# Patient Record
Sex: Female | Born: 1960 | Race: Black or African American | Hispanic: No | Smoking: Never smoker
Health system: Southern US, Community
[De-identification: ages and names within clinical notes are randomized; demographics above are authoritative.]

## PROBLEM LIST (undated history)

## (undated) DIAGNOSIS — G4730 Sleep apnea, unspecified: Secondary | ICD-10-CM

## (undated) DIAGNOSIS — E785 Hyperlipidemia, unspecified: Secondary | ICD-10-CM

## (undated) DIAGNOSIS — N924 Excessive bleeding in the premenopausal period: Secondary | ICD-10-CM

## (undated) HISTORY — DX: Excessive bleeding in the premenopausal period: N92.4

## (undated) HISTORY — DX: Hyperlipidemia, unspecified: E78.5

---

## 1995-03-14 HISTORY — PX: NASAL SEPTUM SURGERY: SHX37

## 1995-03-14 HISTORY — PX: UVULOPALATOPHARYNGOPLASTY: SHX827

## 1997-06-25 ENCOUNTER — Ambulatory Visit (HOSPITAL_BASED_OUTPATIENT_CLINIC_OR_DEPARTMENT_OTHER): Admission: RE | Admit: 1997-06-25 | Discharge: 1997-06-25 | Payer: Self-pay | Admitting: Otolaryngology

## 1999-04-12 ENCOUNTER — Other Ambulatory Visit: Admission: RE | Admit: 1999-04-12 | Discharge: 1999-04-12 | Payer: Self-pay | Admitting: Family Medicine

## 2000-04-12 ENCOUNTER — Other Ambulatory Visit: Admission: RE | Admit: 2000-04-12 | Discharge: 2000-04-12 | Payer: Self-pay | Admitting: Family Medicine

## 2001-05-07 ENCOUNTER — Other Ambulatory Visit: Admission: RE | Admit: 2001-05-07 | Discharge: 2001-05-07 | Payer: Self-pay | Admitting: Family Medicine

## 2001-05-14 ENCOUNTER — Encounter: Admission: RE | Admit: 2001-05-14 | Discharge: 2001-08-12 | Payer: Self-pay | Admitting: Family Medicine

## 2001-10-30 ENCOUNTER — Encounter: Admission: RE | Admit: 2001-10-30 | Discharge: 2002-01-28 | Payer: Self-pay | Admitting: Family Medicine

## 2002-01-30 ENCOUNTER — Encounter: Admission: RE | Admit: 2002-01-30 | Discharge: 2002-04-30 | Payer: Self-pay | Admitting: Family Medicine

## 2002-05-19 ENCOUNTER — Encounter: Admission: RE | Admit: 2002-05-19 | Discharge: 2002-08-17 | Payer: Self-pay | Admitting: Family Medicine

## 2002-05-21 ENCOUNTER — Other Ambulatory Visit: Admission: RE | Admit: 2002-05-21 | Discharge: 2002-05-21 | Payer: Self-pay | Admitting: Family Medicine

## 2003-05-28 ENCOUNTER — Other Ambulatory Visit: Admission: RE | Admit: 2003-05-28 | Discharge: 2003-05-28 | Payer: Self-pay | Admitting: Family Medicine

## 2003-12-30 ENCOUNTER — Inpatient Hospital Stay (HOSPITAL_COMMUNITY): Admission: EM | Admit: 2003-12-30 | Discharge: 2004-01-01 | Payer: Self-pay | Admitting: Emergency Medicine

## 2004-01-22 ENCOUNTER — Ambulatory Visit (HOSPITAL_COMMUNITY): Admission: RE | Admit: 2004-01-22 | Discharge: 2004-01-22 | Payer: Self-pay | Admitting: Family Medicine

## 2004-02-11 ENCOUNTER — Encounter: Admission: RE | Admit: 2004-02-11 | Discharge: 2004-05-11 | Payer: Self-pay | Admitting: Family Medicine

## 2004-07-12 ENCOUNTER — Other Ambulatory Visit: Admission: RE | Admit: 2004-07-12 | Discharge: 2004-07-12 | Payer: Self-pay | Admitting: Family Medicine

## 2005-07-20 IMAGING — NM NM MYOCAR MULTI W/ SPECT
1 series · 1 of 1 positions shown · non-contrast
Comparison: none

CLINICAL DATA: 42 year old with chest pain. 
 NUCLEAR MEDICINE MYOCARDIAL MULTIPLE WITH SPECT, PERFUSION EJECTION FRACTION, PERFUSION WALL MOTION 
 Nuclear medicine myocardial perfusion was performed with Technetium Cardiolite.  Stress and rest imaging was performed.   Gated SPECT images were obtained to evaluate wall motion and myocardial thickening with contraction.   
 The stress and rest SPECT images demonstrate mild apical thinning which is a normal variant. I don?t see any fixed or reversible defects to suggest infarction or ischemia. The gated SPECT images demonstrate normal wall motion and myocardial thickening with contraction.  The patient?s estimated ejection fraction was 82 percent. 
 IMPRESSION 
 Mild apical thinning which is a normal variant.  
 No infarction or ischemia.  
 Normal wall motion with estimated ejection of 82 percent.

[Series 1: sc stress gated cardio · 1 of 1 slices shown]
[im 1/1]
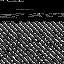

[1 of 1 positions shown; findings below may reference images not displayed]

## 2007-02-28 ENCOUNTER — Encounter: Admission: RE | Admit: 2007-02-28 | Discharge: 2007-02-28 | Payer: Self-pay | Admitting: Family Medicine

## 2007-04-11 ENCOUNTER — Encounter: Admission: RE | Admit: 2007-04-11 | Discharge: 2007-04-11 | Payer: Self-pay

## 2009-02-22 ENCOUNTER — Ambulatory Visit: Payer: Self-pay | Admitting: Occupational Medicine

## 2009-02-22 DIAGNOSIS — J45909 Unspecified asthma, uncomplicated: Secondary | ICD-10-CM | POA: Insufficient documentation

## 2009-02-22 DIAGNOSIS — E785 Hyperlipidemia, unspecified: Secondary | ICD-10-CM | POA: Insufficient documentation

## 2009-04-07 ENCOUNTER — Other Ambulatory Visit: Admission: RE | Admit: 2009-04-07 | Discharge: 2009-04-07 | Payer: Self-pay | Admitting: Family Medicine

## 2009-04-07 ENCOUNTER — Ambulatory Visit: Payer: Self-pay | Admitting: Family Medicine

## 2009-04-07 DIAGNOSIS — R5381 Other malaise: Secondary | ICD-10-CM

## 2009-04-07 DIAGNOSIS — R5383 Other fatigue: Secondary | ICD-10-CM

## 2009-04-08 LAB — CONVERTED CEMR LAB
ALT: 14 units/L (ref 0–35)
AST: 17 units/L (ref 0–37)
Albumin: 4.3 g/dL (ref 3.5–5.2)
Alkaline Phosphatase: 46 units/L (ref 39–117)
BUN: 14 mg/dL (ref 6–23)
CO2: 23 meq/L (ref 19–32)
Calcium: 9.2 mg/dL (ref 8.4–10.5)
Chloride: 106 meq/L (ref 96–112)
Cholesterol: 264 mg/dL — ABNORMAL HIGH (ref 0–200)
Creatinine, Ser: 0.83 mg/dL (ref 0.40–1.20)
Glucose, Bld: 100 mg/dL — ABNORMAL HIGH (ref 70–99)
HCT: 38.6 % (ref 36.0–46.0)
HDL: 49 mg/dL (ref >39)
Hemoglobin: 12.6 g/dL (ref 12.0–15.0)
LDL Cholesterol: 179 mg/dL — ABNORMAL HIGH (ref 0–99)
MCHC: 32.6 g/dL (ref 30.0–36.0)
MCV: 87.1 fL (ref 78.0–100.0)
Platelets: 251 10*3/uL (ref 150–400)
Potassium: 4.2 meq/L (ref 3.5–5.3)
RBC: 4.43 M/uL (ref 3.87–5.11)
RDW: 12.9 % (ref 11.5–15.5)
Sodium: 142 meq/L (ref 135–145)
TSH: 2.481 microintl units/mL (ref 0.350–4.500)
Total Bilirubin: 0.6 mg/dL (ref 0.3–1.2)
Total CHOL/HDL Ratio: 5.4
Total Protein: 7.1 g/dL (ref 6.0–8.3)
Triglycerides: 180 mg/dL — ABNORMAL HIGH (ref <150)
VLDL: 36 mg/dL (ref 0–40)
WBC: 6.5 10*3/uL (ref 4.0–10.5)

## 2009-04-12 ENCOUNTER — Encounter: Payer: Self-pay | Admitting: Family Medicine

## 2009-04-12 LAB — CONVERTED CEMR LAB: Pap Smear: NEGATIVE

## 2009-05-10 ENCOUNTER — Ambulatory Visit: Payer: Self-pay | Admitting: Family Medicine

## 2009-05-10 DIAGNOSIS — E669 Obesity, unspecified: Secondary | ICD-10-CM

## 2009-05-14 ENCOUNTER — Encounter: Payer: Self-pay | Admitting: Family Medicine

## 2009-05-18 ENCOUNTER — Encounter: Payer: Self-pay | Admitting: Family Medicine

## 2009-06-10 ENCOUNTER — Ambulatory Visit: Payer: Self-pay | Admitting: Family Medicine

## 2009-06-10 DIAGNOSIS — E559 Vitamin D deficiency, unspecified: Secondary | ICD-10-CM | POA: Insufficient documentation

## 2009-06-11 LAB — CONVERTED CEMR LAB
ALT: 17 units/L (ref 0–35)
AST: 17 units/L (ref 0–37)
Direct LDL: 117 mg/dL — ABNORMAL HIGH
Vit D, 25-Hydroxy: 59 ng/mL (ref 30–89)

## 2009-07-22 ENCOUNTER — Encounter: Payer: Self-pay | Admitting: Family Medicine

## 2010-04-02 ENCOUNTER — Encounter: Payer: Self-pay | Admitting: Family Medicine

## 2010-04-12 NOTE — Miscellaneous (Signed)
Summary: colonoscopy normal  Clinical Lists Changes  Observations: Added new observation of COLONRECACT: Repeat colonoscopy in 10 years.   (05/14/2009 20:20) Added new observation of COLONOSCOPY: Location:  [facility].    normal.   (05/14/2009 20:20)      Colonoscopy  Procedure date:  05/14/2009  Findings:      Location:  [facility].    normal.    Comments:      Repeat colonoscopy in 10 years.     Colonoscopy  Procedure date:  05/14/2009  Findings:      Location:  [facility].    normal.    Comments:      Repeat colonoscopy in 10 years.

## 2010-04-12 NOTE — Assessment & Plan Note (Signed)
Summary: weight   Vital Signs:  Patient profile:   50 year old female Menstrual status:  regular Height:      63 inches Weight:      175 pounds BMI:     31.11 O2 Sat:      99 % on Room air Temp:     98.6 degrees F oral Pulse rate:   85 / minute BP sitting:   133 / 80  (left arm) Cuff size:   regular  Vitals Entered By: Payton Spark CMA (May 10, 2009 10:39 AM)  O2 Flow:  Room air CC: F/U weight. Phentermine causing insomnia   Primary Care Provider:  Seymour Bars DO  CC:  F/U weight. Phentermine causing insomnia.  History of Present Illness: 50 yo AAF presents for f/u weight mgmt.  She started on Phentermine 1 month ago and did well other than insomnia.  She was taking it at 0800 am. She usually goes to bed at 12:30 am.  She does not drink caffeine. She has noticed that her appetite was decreased on the medication. No heart palpitations or tremor.  She did lose 8 lbs in 1 month.  She has made healthy diet changes.  She is walking more > 1 hr most days/ wk.      Current Medications (verified): 1)  Singulair 10 Mg Tabs (Montelukast Sodium) .... Take 1 Tab By Mouth Once Daily 2)  Crestor 20 Mg Tabs (Rosuvastatin Calcium) .Marland Kitchen.. 1 Tab By Mouth Qhs 3)  Nexium 40 Mg Cpdr (Esomeprazole Magnesium) .... Take 1 Cap By Mouth Once Daily 4)  Symbicort 80-4.5 Mcg/act Aero (Budesonide-Formoterol Fumarate) .... Use As Directed As Needed 5)  Flonase 50 Mcg/act Susp (Fluticasone Propionate) .... Use As Directed As Needed 6)  Proair Hfa 108 (90 Base) Mcg/act Aers (Albuterol Sulfate) .... Use As Directed As Needed 7)  Clobex 0.05 % Lotn (Clobetasol Propionate) .... Use As Directed 8)  Protopic 0.03 % Oint (Tacrolimus) .... Use As Directed  Allergies (verified): 1)  ! Codeine  Review of Systems      See HPI  Physical Exam  General:  alert, well-developed, well-nourished, well-hydrated, and overweight-appearing.   Head:  normocephalic and atraumatic.   Psych:  good eye contact, not  anxious appearing, and not depressed appearing.     Impression & Recommendations:  Problem # 1:  OBESITY, UNSPECIFIED (ICD-278.00) Lost 8 lbs on Phentermine x 3 wks.  Stopped it due to insomnia as SE.  BP looks great.  Worked well to curb her appetite and she is doing better with healthy diet and regular exercise now.  Continue and change to Diethylproprion.  f/u in 1 month.  Call if any problems.  BMI > 31.    Complete Medication List: 1)  Singulair 10 Mg Tabs (Montelukast sodium) .... Take 1 tab by mouth once daily 2)  Crestor 20 Mg Tabs (Rosuvastatin calcium) .Marland Kitchen.. 1 tab by mouth qhs 3)  Nexium 40 Mg Cpdr (Esomeprazole magnesium) .... Take 1 cap by mouth once daily 4)  Symbicort 80-4.5 Mcg/act Aero (Budesonide-formoterol fumarate) .... Use as directed as needed 5)  Flonase 50 Mcg/act Susp (Fluticasone propionate) .... Use as directed as needed 6)  Proair Hfa 108 (90 Base) Mcg/act Aers (Albuterol sulfate) .... Use as directed as needed 7)  Clobex 0.05 % Lotn (Clobetasol propionate) .... Use as directed 8)  Protopic 0.03 % Oint (Tacrolimus) .... Use as directed 9)  Diethylpropion Hcl Cr 75 Mg Xr24h-tab (Diethylpropion hcl) .Marland Kitchen.. 1 tab by mouth  qam, take 30 min before breakfast  Patient Instructions: 1)  Change Phentermine to Diethylproprion as your appetitie suppressant.   2)  Continue healthy diet, regular exercise. 3)  Return in 1 month for f/u visit. Prescriptions: DIETHYLPROPION HCL CR 75 MG XR24H-TAB (DIETHYLPROPION HCL) 1 tab by mouth qAM, take 30 min before breakfast  #30 x 0   Entered and Authorized by:   Seymour Bars DO   Signed by:   Seymour Bars DO on 05/10/2009   Method used:   Printed then faxed to ...       [facility] [phone]* (retail)       [address] [place], [place]  [address]       Ph: [phone]       Fax: [phone]   RxID:   [id number]

## 2010-04-12 NOTE — Assessment & Plan Note (Signed)
Summary: NOV CPE with pap/EKG   Vital Signs:  Patient profile:   50 year old female Menstrual status:  regular LMP:     03/29/2009 Height:      63 inches Weight:      183 pounds BMI:     32.53 O2 Sat:      98 % on Room air Temp:     98.7 degrees F oral Pulse rate:   98 / minute BP sitting:   137 / 80  (left arm) Cuff size:   regular  Vitals Entered By: Payton Spark CMA (April 07, 2009 9:05 AM)  O2 Flow:  Room air CC: New to est. CPE w/ pap LMP (date): 03/29/2009     Menstrual Status regular Enter LMP: 03/29/2009   Primary Care Provider:  Seymour Bars DO  CC:  New to est. CPE w/ pap.  History of Present Illness: 50 yo WF presents for NOV, CPE wtih pap.  She has hx of asthma, high chol, GERD.  She is due for RF on meds and fasting labs.  She is G1P0.  Not in any relationships.  She has some fatigue with weight gain.  She has a + fam hx of premature heart dz. She has not had any CP or DOE.  She is due for her first screening colonoscopy.  Her mammogram is due in March.  Her periods are still regular each month.  Her tetanus was updated about 4 yrs ago.    Current Medications (verified): 1)  Singulair 10 Mg Tabs (Montelukast Sodium) .... Take 1 Tab By Mouth Once Daily 2)  Vytorin 10-20 Mg Tabs (Ezetimibe-Simvastatin) .... Take 1 Tab By Mouth Once Daily 3)  Nexium 40 Mg Cpdr (Esomeprazole Magnesium) .... Take 1 Cap By Mouth Once Daily 4)  Symbicort 80-4.5 Mcg/act Aero (Budesonide-Formoterol Fumarate) .... Use As Directed As Needed 5)  Flonase 50 Mcg/act Susp (Fluticasone Propionate) .... Use As Directed As Needed 6)  Proair Hfa 108 (90 Base) Mcg/act Aers (Albuterol Sulfate) .... Use As Directed As Needed 7)  Clobex 0.05 % Lotn (Clobetasol Propionate) .... Use As Directed 8)  Protopic 0.03 % Oint (Tacrolimus) .... Use As Directed  Allergies (verified): 1)  ! 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  No kids. Lives w/ her sister.  no relationships. Never smoked.  Denies ETOH. No regular exercise.  Review of Systems       no fevers/sweats/weakness, unexplained wt loss/gain, no change in vision, no difficulty hearing, ringing in ears,+ hay fever/allergies, no CP/discomfort, no palpitations, no breast lump/nipple discharge, no cough/wheeze, no blood in stool, N/V/D, no nocturia, no leaking urine, no unusual vag bleeding, no vaginal/penile discharge, no muscle/joint pain, no rash, no new/changing mole, no HA, no memory loss, no anxiety, no sleep problem, no depression, no unexplained lumps, no easy bruising/bleeding, no concern with sexual function   Physical Exam  General:  alert, well-developed, well-nourished, well-hydrated, and overweight-appearing.   Head:  normocephalic and atraumatic.   Eyes:  pupils equal, pupils round, and pupils reactive to light.  allergic shiners present Ears:  no external deformities.   Nose:  no nasal discharge.  Mouth:  good dentition and pharynx pink and moist.   Neck:  no masses.   Breasts:  No mass, nodules, thickening, tenderness, bulging, retraction, inflamation, nipple discharge or skin changes noted.   Lungs:  Normal respiratory effort, chest expands symmetrically. Lungs are clear to auscultation, no crackles or wheezes. Heart:  Normal rate and regular rhythm. S1 and S2 normal without gallop, murmur, click, rub or other extra sounds. Abdomen:  Bowel sounds positive,abdomen soft and non-tender without masses, organomegaly or hernias noted. Genitalia:  Pelvic Exam:        External: normal female genitalia without lesions or masses        Vagina:  normal without lesions or masses        Cervix: normal without lesions or masses        Adnexa: normal bimanual exam without masses or fullness        Uterus: normal by palpation        Pap smear: performed Pulses:  2+ radial and pedal pulses Extremities:  no E/C/C Skin:  color normal and no suspicious lesions.   Cervical Nodes:  No lymphadenopathy noted Psych:  good eye contact, not anxious appearing, and not depressed appearing.     Impression & Recommendations:  Problem # 1:  ROUTINE GYNECOLOGICAL EXAMINATION (ICD-V72.31) Keeping healthy checklist for women reviewed. BP in the pre-HTN range. RFd chronic meds. Update fasting labs. Schedule 1st screening colonoscopy. Tdap is UTD. Mammogram due in march at [place]. EKG today for + fam hx.  NSR at 87 bpm.  Normal axis.  No ischemia.  QTc 411 msec.  Complete Medication List: 1)  Singulair 10 Mg Tabs (Montelukast sodium) .... Take 1 tab by mouth once daily 2)  Vytorin 10-20 Mg Tabs (Ezetimibe-simvastatin) .... Take 1 tab by mouth once daily 3)  Nexium 40 Mg Cpdr (Esomeprazole magnesium) .... Take 1 cap by mouth once daily 4)  Symbicort 80-4.5 Mcg/act Aero (Budesonide-formoterol fumarate) .... Use as directed as needed 5)  Flonase 50 Mcg/act Susp (Fluticasone propionate) .... Use as directed as needed 6)  Proair Hfa 108 (90 Base) Mcg/act Aers (Albuterol sulfate) .... Use as directed as needed 7)  Clobex 0.05 % Lotn (Clobetasol propionate) .... Use as directed 8)  Protopic 0.03 % Oint (Tacrolimus) .... Use as directed 9)  Phentermine Hcl 15 Mg Caps (Phentermine hcl) .Marland Kitchen.. 1 capsule by mouth qam; take 30 min before breakfast  Other Orders: T-CBC No Diff ([id number]) T-Lipid Profile ([id number]) T-Comprehensive Metabolic Panel ([id number]) T-TSH ([id number]) Gastroenterology Referral (GI)  Patient Instructions: 1)  Meds RFd. 2)  Update fasting labs today. 3)  Will call you with pap smear and lab results in the next 2  days. 4)  Work on healthy diet: 1500 kcal/ day + 1 hr of exercise 4-5 days/ wk. 5)  REturn in 1 month for f/u Prescriptions: PHENTERMINE HCL 15 MG CAPS (PHENTERMINE HCL) 1 capsule by mouth qAM; take 30 min before breakfast  #30 x 0   Entered and Authorized by:   Seymour Bars DO   Signed by:   Seymour Bars DO on 04/07/2009   Method used:   Printed then faxed to ...       [facility] [phone]* (retail)       [address] [place], [place]  [address]       Ph: [phone]       Fax: [phone]   RxID:   [id number] PROTOPIC 0.03 % OINT (  TACROLIMUS) Use as directed  #1 tube x 2   Entered and Authorized by:   Seymour Bars DO   Signed by:   Seymour Bars DO on 04/07/2009   Method used:   Electronically to        [facility] [phone]* (retail)       [address] [place], [place]  [address]       Ph: [phone]       Fax: [phone]   RxID:   [id number] CLOBEX 0.05 % LOTN (CLOBETASOL PROPIONATE) Use as directed  #1 tube x 2   Entered and Authorized by:   Seymour Bars DO   Signed by:   Seymour Bars DO on 04/07/2009   Method used:   Electronically to        [facility] [phone]* (retail)       [address]       [place], [place]  [address]       Ph: [phone]       Fax: [phone]   RxID:   [id number] PROAIR HFA 108 (90 BASE) MCG/ACT AERS (ALBUTEROL SULFATE) Use as directed as needed  #1 x 2   Entered and Authorized by:   Seymour Bars DO   Signed by:   Seymour Bars DO on 04/07/2009   Method used:   Electronically to        [facility] [phone]* (retail)       [address]       [place], [place]  [address]       Ph: [phone]       Fax: [phone]   RxID:   [id number] FLONASE 50 MCG/ACT SUSP (FLUTICASONE PROPIONATE) Use as directed as needed  #1 x 3   Entered and Authorized by:   Seymour Bars DO   Signed by:   Seymour Bars DO on 04/07/2009   Method used:   Electronically to        [facility] [phone]* (retail)        [address]       [place], [place]  [address]       Ph: [phone]       Fax: [phone]   RxID:   [id number] SYMBICORT 80-4.5 MCG/ACT AERO (BUDESONIDE-FORMOTEROL FUMARATE) Use as directed as needed  #1 x 3   Entered and Authorized by:   Seymour Bars DO   Signed by:   Seymour Bars DO on 04/07/2009   Method used:   Electronically to        [facility] [phone]* (retail)       [address]       [place], [place]  [address]       Ph: [phone]       Fax: [phone]   RxID:   [id number] NEXIUM 40 MG CPDR (ESOMEPRAZOLE MAGNESIUM) Take 1 cap by mouth once daily  #30 x 6   Entered and Authorized by:   Seymour Bars DO   Signed by:   Seymour Bars DO on 04/07/2009   Method used:   Electronically to        [facility] [phone]* (retail)       [address]       [place], [place]  [address]       Ph: [phone]       Fax: [phone]   RxID:   [id number]  10-20 MG TABS (EZETIMIBE-SIMVASTATIN) Take 1 tab by mouth once daily  #30 x 3   Entered and Authorized by:   Seymour Bars DO   Signed by:   Seymour Bars DO on 04/07/2009   Method used:   Electronically to        [facility] [phone]* (retail)       [address] [place], [place]  [address]       Ph: [phone]       Fax: [phone]   RxID:   [id number] SINGULAIR 10 MG TABS (MONTELUKAST SODIUM) Take 1 tab by mouth once daily  #30 x 3   Entered and Authorized by:   Seymour Bars DO   Signed by:   Seymour Bars DO on 04/07/2009   Method used:   Electronically to        [facility] [phone]* (retail)       [address]       [place], [place]  [address]       Ph: [phone]       Fax: [phone]   RxID:   [id number]    Tetanus/Td Immunization History:    Tetanus/Td # 1:  Historical (03/13/2006)  Influenza Immunization History:    Influenza # 1:  Historical (12/11/2008)  Appended Document: NOV CPE with pap/EKG

## 2010-04-12 NOTE — Assessment & Plan Note (Signed)
Summary: f/u weight/ cholesterol   Vital Signs:  Patient profile:   50 year old female Menstrual status:  regular Height:      63 inches Weight:      175 pounds BMI:     31.11 O2 Sat:      98 % on Room air Pulse rate:   92 / minute BP sitting:   128 / 84  (left arm) Cuff size:   regular  Vitals Entered By: Payton Spark CMA (June 10, 2009 8:29 AM)  O2 Flow:  Room air CC: F/U weight. Did not complete diethylpropion.    Primary Care Provider:  Seymour Bars DO  CC:  F/U weight. Did not complete diethylpropion. Marland Kitchen  History of Present Illness: 50 yo AAF presents for f/u weight managment.  She took Diethylproprion for weight loss for 16 days in the past month but stopped it due to continued insomnia.  She did not notice much impact in her appetite.    She denies heart palpitations.  She plans to walk more.  She is trying to eat more fruits and veggies.  She has cut back on fried foods.  She gave up chocholate for [place].  She avoids caffeine.  She is drinking more water.  She is feeling tired still from not sleeping well.  Her weight is unchanged.      Current Medications (verified): 1)  Singulair 10 Mg Tabs (Montelukast Sodium) .... Take 1 Tab By Mouth Once Daily 2)  Crestor 20 Mg Tabs (Rosuvastatin Calcium) .Marland Kitchen.. 1 Tab By Mouth Qhs 3)  Nexium 40 Mg Cpdr (Esomeprazole Magnesium) .... Take 1 Cap By Mouth Once Daily 4)  Symbicort 80-4.5 Mcg/act Aero (Budesonide-Formoterol Fumarate) .... Use As Directed As Needed 5)  Flonase 50 Mcg/act Susp (Fluticasone Propionate) .... Use As Directed As Needed 6)  Proair Hfa 108 (90 Base) Mcg/act Aers (Albuterol Sulfate) .... Use As Directed As Needed 7)  Clobex 0.05 % Lotn (Clobetasol Propionate) .... Use As Directed 8)  Protopic 0.03 % Oint (Tacrolimus) .... Use As Directed  Allergies (verified): 1)  ! Codeine  Past History:  Past Medical History: Reviewed history from 04/07/2009 and no changes required. G1P0010 GERD (EGD 07) High  cholesterol IFG asthma/ allergies/ eczema  Family History: Reviewed history from 04/07/2009 and no changes required. mother died at 56, had AMI in late 69s, High chol, HTN, DM father died at 60, had CVAs, AMI, high chol, HTN, alzheimers MAunt bladder cancder GM Leukemia  Social History: Reviewed history from 04/07/2009 and no changes required. 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 Lives w/ her sister.  no relationships. Never smoked.  Denies ETOH. No regular exercise.  Review of Systems      See HPI  Physical Exam  General:  alert, well-developed, well-nourished, well-hydrated, and overweight-appearing.   Head:  normocephalic and atraumatic.   Eyes:  allergic shiners present conjunctiva clear Ears:  no external deformities.   Mouth:  pharynx pink and moist.   Neck:  no masses.   Lungs:  Normal respiratory effort, chest expands symmetrically. Lungs are clear to auscultation, no crackles or wheezes. Heart:  Normal rate and regular rhythm. S1 and S2 normal without gallop, murmur, click, rub or other extra sounds. Extremities:  no LE edema Skin:  color normal.   hand eczema Psych:  good eye contact, not anxious appearing, and not depressed appearing.     Impression & Recommendations:  Problem # 1:  OBESITY, UNSPECIFIED (ICD-278.00) BMI/ weight  unchanged.  BMI 31 c/w class I obesity.  Could not tolerate SE from Diethylproprion or Phentermine (on each for about 2 wks).  Will avoid future use of appetitie suppressants and reset her sleep with samples of Lunesta 2 mg at bedtime today (#4 tabs ) given. It seems that her inability to lose weight is from not attempting to make adequate diet or exercise changes.  Explained to pt that without changes, she will not see results.  Problem # 2:  HYPERLIPIDEMIA (ICD-272.4) Repeat labs today to see if Crestor is working. Her updated medication list for this problem includes:    Crestor 20 Mg Tabs (Rosuvastatin  calcium) .Marland Kitchen... 1 tab by mouth qhs  Orders: T-LDL Direct ([id number]) T-ALT/SGPT ([id number]) T-AST/SGOT ([id number])  Labs Reviewed: SGOT: 17 (04/07/2009)   SGPT: 14 (04/07/2009)   HDL:49 (04/07/2009)  LDL:179 (04/07/2009)  Chol:264 (04/07/2009)  Trig:180 (04/07/2009)  Complete Medication List: 1)  Singulair 10 Mg Tabs (Montelukast sodium) .... Take 1 tab by mouth once daily 2)  Crestor 20 Mg Tabs (Rosuvastatin calcium) .Marland Kitchen.. 1 tab by mouth qhs 3)  Nexium 40 Mg Cpdr (Esomeprazole magnesium) .... Take 1 cap by mouth once daily 4)  Symbicort 80-4.5 Mcg/act Aero (Budesonide-formoterol fumarate) .... Use as directed as needed 5)  Flonase 50 Mcg/act Susp (Fluticasone propionate) .... Use as directed as needed 6)  Proair Hfa 108 (90 Base) Mcg/act Aers (Albuterol sulfate) .... Use as directed as needed 7)  Clobex 0.05 % Lotn (Clobetasol propionate) .... Use as directed 8)  Protopic 0.03 % Oint (Tacrolimus) .... Use as directed  Other Orders: T-Vitamin D (25-Hydroxy) ([id number])  Patient Instructions: 1)  Labs today. 2)  Will call you w/ results tomorrow. 3)  Reset sleep cycle with use of Lunesta 2 mg tab at bedtime.  (#4 tabs given). 4)  Work on [employer] with 1 hr of exercise 5 days/ wk. 5)  Return for f/u in 4 mos.

## 2010-04-12 NOTE — Letter (Signed)
Summary: [facility]  [facility]   Imported By: Lanelle Bal 05/04/2009 13:43:30  _____________________________________________________________________  External Attachment:    Type:   Image     Comment:   External Document

## 2010-04-12 NOTE — Miscellaneous (Signed)
Summary: mammogram normal  Clinical Lists Changes  Observations: Added new observation of MAMMRECACT: Screening mammogram in 1 year.    (07/21/2009 12:44) Added new observation of MAMMOGRAM: [facility]  No significant changes compared to previous study.  Assessment: BIRADS 1.  (07/21/2009 12:44)      Mammogram  Procedure date:  07/21/2009  Findings:      [facility]  No significant changes compared to previous study.  Assessment: BIRADS 1.   Comments:      Screening mammogram in 1 year.      Mammogram  Procedure date:  07/21/2009  Findings:      [facility]  No significant changes compared to previous study.  Assessment: BIRADS 1.   Comments:      Screening mammogram in 1 year.

## 2010-04-12 NOTE — Letter (Signed)
Summary: Letter with Colonoscopy Results/[facility]  Letter with Colonoscopy Results/[facility]   Imported By: Lanelle Bal 05/24/2009 13:30:20  _____________________________________________________________________  External Attachment:    Type:   Image     Comment:   External Document

## 2010-05-06 ENCOUNTER — Encounter: Payer: Self-pay | Admitting: Family Medicine

## 2010-05-06 ENCOUNTER — Other Ambulatory Visit: Payer: Self-pay | Admitting: Family Medicine

## 2010-05-06 ENCOUNTER — Encounter (INDEPENDENT_AMBULATORY_CARE_PROVIDER_SITE_OTHER): Payer: Self-pay | Admitting: Family Medicine

## 2010-05-06 DIAGNOSIS — Z01419 Encounter for gynecological examination (general) (routine) without abnormal findings: Secondary | ICD-10-CM

## 2010-05-06 LAB — CYTOLOGY - PAP: Pap Smear: NORMAL

## 2010-05-09 LAB — CONVERTED CEMR LAB
ALT: 17 units/L (ref 0–35)
AST: 19 units/L (ref 0–37)
Albumin: 4.2 g/dL (ref 3.5–5.2)
Alkaline Phosphatase: 51 units/L (ref 39–117)
BUN: 13 mg/dL (ref 6–23)
CO2: 24 meq/L (ref 19–32)
Calcium: 9.4 mg/dL (ref 8.4–10.5)
Chloride: 104 meq/L (ref 96–112)
Cholesterol: 182 mg/dL (ref 0–200)
Creatinine, Ser: 0.85 mg/dL (ref 0.40–1.20)
Glucose, Bld: 106 mg/dL — ABNORMAL HIGH (ref 70–99)
HDL: 46 mg/dL (ref >39)
LDL Cholesterol: 106 mg/dL — ABNORMAL HIGH (ref 0–99)
Potassium: 4.1 meq/L (ref 3.5–5.3)
Sodium: 140 meq/L (ref 135–145)
Total Bilirubin: 0.6 mg/dL (ref 0.3–1.2)
Total CHOL/HDL Ratio: 4
Total Protein: 6.6 g/dL (ref 6.0–8.3)
Triglycerides: 149 mg/dL (ref <150)
VLDL: 30 mg/dL (ref 0–40)

## 2010-05-10 NOTE — Assessment & Plan Note (Signed)
Summary: CPE with pap   Vital Signs:  Patient profile:   50 year old female Height:      63 inches (160.02 cm) Weight:      186 pounds (84.55 kg) BMI:     33.07 Temp:     97.8 degrees F (36.56 degrees C) oral BP sitting:   136 / 84  (right arm) Cuff size:   regular  Vitals Entered By: Lucious Groves CMA (May 06, 2010 10:32 AM) CC: CPE./kb   Primary Care Provider:  Seymour Bars DO  CC:  CPE./kb.  History of Present Illness: 50 yo AAF presents for CPE with pap.  She continues to have regular periods each month, not too heavy.  She is not having too many vasomotor symptoms.    She had a normal pap smear last year.  She is not sexually active.  She had a normal stress test 5 yrs ago.  She was found to have esophageal spasm at the time.  She does have a fam hx of premature heart dz.   She had her mammogram in May. Due for fasting labs. Immunizations UTD.      Current Medications (verified): 1)  Singulair 10 Mg Tabs (Montelukast Sodium) .... Take 1 Tab By Mouth Once Daily 2)  Nexium 40 Mg Cpdr (Esomeprazole Magnesium) 3)  Symbicort 80-4.5 Mcg/act Aero (Budesonide-Formoterol Fumarate) 4)  Flonase 50 Mcg/act Susp (Fluticasone Propionate) .... Two Sprays in Each Nostril Once A Day 5)  Crestor 20 Mg Tabs (Rosuvastatin Calcium) .Marland Kitchen.. 1 By Mouth Once Daily  Allergies (verified): 1)  ! 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    Physical Exam  General:  alert, well-developed, well-nourished, well-hydrated, and overweight-appearing.   Head:  normocephalic and atraumatic.   Eyes:  PERRLA, allergic shiners present Ears:  EACs patent; TMs translucent and gray with good cone of light and bony landmarks.  Nose:  no nasal discharge.   Mouth:  good dentition and pharynx pink and moist.   Neck:  no masses.   Breasts:  No mass, nodules, thickening, tenderness, bulging, retraction, inflamation, nipple discharge or skin changes noted.   Lungs:  Normal respiratory effort, chest expands symmetrically. Lungs are clear to auscultation, no crackles or wheezes. Heart:  Normal rate and regular rhythm. S1 and S2 normal without gallop, murmur, click, rub or other extra sounds. Abdomen:  Bowel sounds positive,abdomen soft and non-tender without masses, organomegaly , no AA bruits Genitalia:  Pelvic Exam:        External: normal female genitalia without lesions or masses        Vagina: normal without lesions or masses        Cervix: normal without lesions or masses        Adnexa: normal bimanual exam without masses or fullness        Uterus: normal by palpation        Pap smear: performed Pulses:  2+ radial and pedal pulses Extremities:  no LE edema Skin:  color normal.  Cervical Nodes:  No lymphadenopathy noted Psych:  good eye contact, not anxious appearing, and not depressed appearing.     Impression & Recommendations:  Problem # 1:  ROUTINE GYNECOLOGICAL EXAMINATION (ICD-V72.31) Keeping healthy checklist for women reviewed. BP at goal.  BMI 33 c/w class I obesity. Counseled on healthy diet, regular exercise. Continue MVI + Calcium wtih D daily. Update fasting labs today and adjust Crestor as needed. Immunizations UTD. Colonoscopy normal 2011, Mammogram done 07-2009. RTC for next CPE in 1  yr. Update stress test given fam hx and it has been 5 yrs.  Complete Medication List: 1)  Singulair 10 Mg Tabs (Montelukast sodium) .... Take 1 tab by mouth once daily 2)  Nexium 40 Mg Cpdr (Esomeprazole magnesium) 3)  Symbicort 80-4.5 Mcg/act Aero (Budesonide-formoterol fumarate) 4)  Flonase 50 Mcg/act Susp (Fluticasone propionate) .... Two sprays in each nostril once a day 5)  Crestor 20 Mg Tabs (Rosuvastatin calcium) .Marland Kitchen.. 1 by mouth once daily  Other Orders: T-Comprehensive Metabolic Panel [phone]) T-Lipid Profile ([id number]) T-Treadmill Complete/ Pharmacologic Stress [phone])  Patient Instructions: 1)  Update fasting labs today. 2)  Will call you with pap smear and lab results next wk. 3)  I will RF your cholesterol medicine next wk. 4)  Return in 1 yr for next physical, sooner if needed. 5)  Will update your stress test in [place] -- Victorino Dike will call you to set this up.   Orders Added: 1)  T-Comprehensive Metabolic Panel [[id number]] 2)  T-Lipid Profile [[id number]] 3)  T-Treadmill Complete/ Pharmacologic Stress [[id number]] 4)  Est. Patient age 52-52 [phone]

## 2010-05-11 ENCOUNTER — Other Ambulatory Visit (HOSPITAL_COMMUNITY)
Admission: RE | Admit: 2010-05-11 | Discharge: 2010-05-11 | Disposition: A | Discharge status: Home / Self Care | Payer: 59 | Source: Ambulatory Visit | Attending: Family Medicine | Admitting: Family Medicine

## 2010-05-11 ENCOUNTER — Telehealth (INDEPENDENT_AMBULATORY_CARE_PROVIDER_SITE_OTHER): Payer: Self-pay | Admitting: *Deleted

## 2010-05-11 DIAGNOSIS — Z01419 Encounter for gynecological examination (general) (routine) without abnormal findings: Secondary | ICD-10-CM | POA: Insufficient documentation

## 2010-05-13 ENCOUNTER — Encounter: Payer: Self-pay | Admitting: Family Medicine

## 2010-05-19 NOTE — Progress Notes (Signed)
Summary: Stress test referral status  Phone Note Call from Patient   Caller: Patient Summary of Call: Patient has been scheduled for a stress test at [facility] in [place] for 05-25-10 at [facility] in [place] at 12:30.. I called patient at work and gave her all the appt info.Michaelle Copas  May 11, 2010 2:49 PM  Initial call taken by: Michaelle Copas,  May 11, 2010 2:49 PM

## 2010-05-24 ENCOUNTER — Telehealth (INDEPENDENT_AMBULATORY_CARE_PROVIDER_SITE_OTHER): Payer: Self-pay | Admitting: *Deleted

## 2010-05-25 ENCOUNTER — Encounter: Payer: Self-pay | Admitting: Cardiology

## 2010-05-25 ENCOUNTER — Encounter (INDEPENDENT_AMBULATORY_CARE_PROVIDER_SITE_OTHER): Payer: Self-pay | Admitting: *Deleted

## 2010-05-25 ENCOUNTER — Ambulatory Visit (HOSPITAL_COMMUNITY): Payer: 59 | Attending: Cardiology

## 2010-05-25 DIAGNOSIS — R0602 Shortness of breath: Secondary | ICD-10-CM

## 2010-05-25 DIAGNOSIS — I519 Heart disease, unspecified: Secondary | ICD-10-CM | POA: Insufficient documentation

## 2010-05-25 DIAGNOSIS — E785 Hyperlipidemia, unspecified: Secondary | ICD-10-CM | POA: Insufficient documentation

## 2010-05-31 NOTE — Progress Notes (Signed)
Summary: Nuclear Pre-Procedure  Phone Note Outgoing Call Call back at [facility] Phone [phone]   Call placed by: Stanton Kidney, EMT-P,  May 24, 2010 3:41 PM Call placed to: Patient Action Taken: Phone Call Completed Summary of Call: Reviewed information on [employer] Information Sheet (see scanned document for further details).  Spoke with the patient. Stanton Kidney, EMT-P  May 24, 2010 3:42 PM      Nuclear Med Background Indications for Stress Test: Evaluation for Ischemia   History: Asthma      Nuclear Pre-Procedure Cardiac Risk Factors: Lipids Height (in): 63  Nuclear Med Study Referring MD:  Seymour Bars

## 2010-05-31 NOTE — Assessment & Plan Note (Signed)
Summary: Cardiology Nuclear Testing  Nuclear Med Background Indications for Stress Test: Evaluation for Ischemia   History: Asthma, GXT  History Comments: '06 GXT:OK per patient  Symptoms: DOE    Nuclear Pre-Procedure Cardiac Risk Factors: Family History - CAD, Lipids, Obesity Caffeine/Decaff Intake: None NPO After: 8:30 AM Lungs: Clear. IV 0.9% NS with Angio Cath: 20g     IV Site: R Antecubital IV Started by: Stanton Kidney, EMT-P Chest Size (in) 40     Cup Size C     Height (in): 63 Weight (lb): 182 BMI: 32.36  Nuclear Med Study 1 or 2 day study:  1 day     Stress Test Type:  Stress Reading MD:  Cassell Clement, MD     Referring MD:  Seymour Bars, DO Resting Radionuclide:  Technetium 56m Tetrofosmin     Resting Radionuclide Dose:  11 mCi  Stress Radionuclide:  Technetium 33m Tetrofosmin     Stress Radionuclide Dose:  33 mCi   Stress Protocol Exercise Time (min):  7:30 min     Max HR:  166 bpm     Predicted Max HR:  169 bpm  Max Systolic BP: 188 mm Hg     Percent Max HR:  98.22 %     METS: 9.3 Rate Pressure Product:  98119    Stress Test Technologist:  Rea College, CMA-N     Nuclear Technologist:  Domenic Polite, CNMT  Rest Procedure  Myocardial perfusion imaging was performed at rest 45 minutes following the intravenous administration of Technetium 67m Tetrofosmin.  Stress Procedure  The patient exercised for 7:30 on the treadmill utilizing the Bruce protocol.  The patient stopped due to fatigue and denied any chest pain.  There were no diagnostic ST-T wave changes.  Technetium 42m Tetrofosmin was injected at peak exercise and myocardial perfusion imaging was performed after a brief delay.  QPS Raw Data Images:  Normal; no motion artifact; normal heart/lung ratio. Stress Images:  Normal homogeneous uptake in all areas of the myocardium. Rest Images:  Normal homogeneous uptake in all areas of the myocardium. Subtraction (SDS):  No evidence of ischemia. Transient  Ischemic Dilatation:  .64  (Normal <1.22)  Lung/Heart Ratio:  .31  (Normal <0.45)  Quantitative Gated Spect Images QGS EDV:  44 ml QGS ESV:  9 ml QGS EF:  79 % QGS cine images:  No wall motion abnormalities.  Findings Normal nuclear study      Overall Impression  Exercise Capacity: Good exercise capacity. BP Response: Normal blood pressure response. Clinical Symptoms: No chest pain ECG Impression: No significant ST segment change suggestive of ischemia. Overall Impression: Normal stress nuclear study.

## 2010-06-09 ENCOUNTER — Other Ambulatory Visit: Payer: Self-pay | Admitting: Family Medicine

## 2010-06-09 DIAGNOSIS — K219 Gastro-esophageal reflux disease without esophagitis: Secondary | ICD-10-CM

## 2010-06-21 ENCOUNTER — Telehealth: Payer: Self-pay | Admitting: *Deleted

## 2010-06-21 NOTE — Telephone Encounter (Signed)
Pt called for results of stress test. I found results and reported normal results to Pt.

## 2010-07-18 ENCOUNTER — Other Ambulatory Visit: Payer: Self-pay | Admitting: *Deleted

## 2010-07-18 ENCOUNTER — Telehealth: Payer: Self-pay | Admitting: Family Medicine

## 2010-07-18 DIAGNOSIS — J302 Other seasonal allergic rhinitis: Secondary | ICD-10-CM

## 2010-07-18 DIAGNOSIS — E785 Hyperlipidemia, unspecified: Secondary | ICD-10-CM

## 2010-07-18 MED ORDER — MONTELUKAST SODIUM 10 MG PO TABS: 10.0000 mg | ORAL_TABLET | Freq: Every day | ORAL | Status: DC

## 2010-07-18 MED ORDER — ROSUVASTATIN CALCIUM 20 MG PO TABS: 20.0000 mg | ORAL_TABLET | Freq: Every day | ORAL | Status: DC

## 2010-07-18 NOTE — Telephone Encounter (Signed)
Pt called and pharm has contacted [place] twice and no refills as of yet for her singulair 10mg  PO QD and crestor 20 mg PO QHS.  Had OV on 05-06-10 CPE/PAP.   Plan:  Refilled both of these meds #30/3rfs since CPE date good. Jarvis Newcomer, LPN Domingo Dimes

## 2010-07-29 NOTE — Consult Note (Signed)
NAMEADREA, SHERPA NO.:  [id number]   MEDICAL RECORD NO.:  [id number]          PATIENT TYPE:  INP   LOCATION:  4705                         FACILITY:  [facility]   PHYSICIAN:  Pricilla Riffle, M.D.    DATE OF BIRTH:  05/01/1960   DATE OF CONSULTATION:  12/31/2003  DATE OF DISCHARGE:                                   CONSULTATION   IDENTIFICATION:  Ms. Elizabeth Dawson is a 50 year old, whom I was asked to see  regarding chest pain.   The patient has no known cardiac history.  Tuesday, p.m., she walked on the  treadmill 2 miles with no problems.  She woke up at 5 a.m. on Wednesday with  chest pain.  The pain began under her breasts laterally and went to the mid  chest.  Pain/pressure would not go away, not positional, nonpleuritic.  Took  2 aspirin with question mild relief.  Came to the emergency room.  Since  yesterday, has not had any chest pain, no recent illness, no injury.  Notes  different from reflux.   ALLERGIES:  CODEINE.   MEDICATIONS PRIOR TO ADMISSION:  1.  Nexium 40 daily.  2.  Nasonex.  3.  Vytorin 10/20.  4.  OCP.  5.  Allegra.  6.  Note, patient had been on phentermine prior, has been taking since June      or July for weight loss.  Did not take for the past 2 weeks.   MEDICATIONS HERE:  1.  Aspirin.  2.  Lopressor 25 b.i.d.  3.  Altace 5 daily.  4.  Vytorin 10/20 daily.  5.  Allegra daily.  6.  Protonix and Lovenox 40 daily.   PAST MEDICAL HISTORY:  Dyslipidemia, on Lipitor, now Vytorin.  Total  cholesterol by report less than 202.   FAMILY HISTORY:  Mom with CAD, head MI in 70s, died at age 26.  Father with  question hypertension.  One brother died of an MI at age 14.   SOCIAL HISTORY:  The patient does not smoke, does not drink.   REVIEW OF SYSTEMS:  A 22 pound weight loss since this summer.  Otherwise,  all systems reviewed, negative to the above problems except as noted.   PHYSICAL EXAMINATION:  GENERAL:  The patient is in no distress.  VITAL SIGNS:  Blood pressure 102/115, pulse 90s-120 (sinus rhythm to sinus  tach), respiratory rate 14, O2 saturation on room air 98%.  NECK:  JVP is normal.  No bruits.  LUNGS:  Clear.  CHEST:  Nontender.  CARDIAC:  Regular rate and rhythm, S1, S2.  No S3 or S4.  No murmurs.  ABDOMEN:  No hepatosplenomegaly.  EXTREMITIES:  2+ pulses.  No lower extremity edema.   LABORATORY DATA:  Significant for a hemoglobin of 12, WBC 9.9.  BUN and  creatinine of 5 and 0.9.  Potassium of 3.5.  AST 65, ALT 105.  Troponin less  than 0.01 x 3.  CK 104, MB 1.4; 107, 1.3; 96, 0.8.  TSH normal at 1.96.  CT  negative for PE.  Normal RV and LV  size.  EKG:  Sinus tachycardia at a rate  of 101 beats per minute.  No acute ST changes.   IMPRESSION:  Chest pain, atypical, question gastrointestinal.  Has ruled out  for myocardial infarction.  CT negative.  Right ventricle appears normal in  size.  The patient with strong family history and also dyslipidemia.  Note,  has been on phentermine.   RECOMMEND:  GXT Cardiolite, consult on stopping phentermine, would check  labs (LFTs on Vytorin) from primary MD, reasonable to hold if ultrasound is  negative and check in a couple of weeks.   Hold Lopressor tonight and tomorrow.  NPO after midnight.  We will continue  to follow.       PVR/MEDQ  D:  12/31/2003  T:  12/31/2003  Job:  [id number]

## 2010-07-29 NOTE — H&P (Signed)
Elizabeth Dawson, Elizabeth Dawson              ACCOUNT NO.:  [id number]   MEDICAL RECORD NO.:  [id number]          PATIENT TYPE:  EMS   LOCATION:  MAJO                         FACILITY:  [facility]   PHYSICIAN:  Danae Chen, M.D.DATE OF BIRTH:  [birth year]   DATE OF ADMISSION:  12/30/2003  DATE OF DISCHARGE:                                HISTORY & PHYSICAL   PRIMARY CARE PHYSICIAN:  Talmadge Coventry, M.D.   CHIEF COMPLAINT:  Chest pain.   HISTORY OF PRESENT ILLNESS:  The patient is a 50 year old African-American  female who presents to the emergency room with complaints of chest  discomfort and abdominal discomfort, which awoke her from her sleep early  this morning around 5 a.m.  She reports that she was in her usual state of  health when she went to bed but did have some epigastric-type tenderness  which awoke her from sleep.  It was an intense and sharp pain that did not  radiate to her back, arm, or jaw.  It was a sudden onset and it was  associated with nausea and one episode of emesis.  She also felt diaphoretic  as well.  She did take two aspirin at home before being transported to the  emergency room.  She has not had any prior episodes of this type of  discomfort before.  She also did not have any shortness of breath or cough  with this.  There was no effect of inspiration or expiration on the pain.  She reports no alcohol use.  She has had no past cardiac testing to her  knowledge other than an EKG in her primary care physician's office.  Her  cardiac risk factors include hyperlipidemia.  She denies hypertension,  diabetes, smoking.  She also has a strong family history of coronary artery  disease with early to mid-adulthood in first degree relatives.   PAST MEDICAL HISTORY:  1.  She has seasonal allergies.  2.  Reflux disease.  3.  Hyperlipidemia.   No prior surgical history.   SOCIAL HISTORY:  Denies smoking, no alcohol, no drug use.   MEDICATIONS:  1.  Nexium 40 mg  p.o. daily.  2.  Nasonex 50 mcg nasal spray daily.  3.  Vytorin 10/20 mg p.o. daily.  4.  __________ oral birth control pills.  5.  Allegra 180 mg p.o. daily.   She is allergic to CODEINE.   REVIEW OF SYSTEMS:  The patient denies any recent weight gain or weight  loss.  No diarrhea or constipation.  No headache. No hematochezia or  hemoptysis.   PHYSICAL EXAMINATION:  VITAL SIGNS:  Temperature is 97.4, blood pressure  117/58, pulse 104, respirations 20.  O2 saturation is 100% on room air.  GENERAL:  She is in no acute distress, resting comfortably in the emergency  room.  HEENT:  Normal funduscopic exam.  Pupils equal and reactive.  CHEST:  Lung fields are clear to auscultation bilaterally.  CARDIOVASCULAR:  Regular rate and rhythm, no murmurs, rubs, or gallops  appreciated.  ABDOMEN:  Soft, nontender, no rebound, no guarding, and active bowel sounds  are  present.  EXTREMITIES:  There is no peripheral edema.  She has 2+ dorsalis pedis  pulses.  NEUROLOGIC:  Cranial nerves II-XII are grossly intact.  She has 5/5 muscle  strength, upper and lower extremities.   LABORATORY DATA:  Sodium 139, potassium 3.3, chloride 108, BUN 9, glucose  131.  A pH of 7.6, PCO2 of 18, bicarb of 18.  Hemoglobin of 14.3.  Initial  CK-MB was 38.3, troponin was less than 0.05.  White count of 9.2, platelets  of 280.  D-dimer was negative.  Creatinine 1.0.  Chest x-ray shows no acute  disease.  EKG shows sinus tachycardia with rightward axis deviation.  There  is no significant ST or T-wave abnormality noted.  There is a possible  slight ST elevation in aVR but not Q-waves or ST elevation or segment  depressions are seen.  Spiral CT shows no acute abnormality, no evidence of  pulmonary embolus.   ASSESSMENT:  A 50 year old African-American female with cardiac risk factors  that include high cholesterol and strong family history, who presents with  atypical chest discomfort.  Differential diagnosis does  include acute  cholecystitis or peptic ulcer disease in addition to acute coronary  syndrome.  However, given lack of significant electrocardiogram changes and  negative initial cardiac enzymes, the likelihood is relatively low that this  represents an acute coronary vent.  However, we will admit her to telemetry,  obtain serial cardiac enzymes, start beta blocker, aspirin, oxygen, and  morphine, and continue the heparin, which was begun in the emergency room.  Will also obtain a right upper quadrant ultrasound during this admission to  assess for possible cholecystitis as well.   PLAN:  1.  Admit to telemetry.  2.  Serial cardiac enzymes.  3.  Aspirin, beta blocker, oxygen, and heparin and ACE inhibitor and statin.  4.  Right upper quadrant ultrasound.  5.  Cardiology consult for risk stratification procedure.       RLK/MEDQ  D:  12/30/2003  T:  12/30/2003  Job:  [id number]   cc:   Talmadge Coventry, M.D.  [address]  [place]  [place] [address]  Fax: [phone]

## 2010-07-29 NOTE — Discharge Summary (Signed)
Elizabeth Dawson, Elizabeth Dawson              ACCOUNT NO.:  [id number]   MEDICAL RECORD NO.:  [id number]          PATIENT TYPE:  INP   LOCATION:                               FACILITY:  [facility]   PHYSICIAN:  Mobolaji B. Bakare, M.D.DATE OF BIRTH:  [birth year]   DATE OF ADMISSION:  12/30/2003  DATE OF DISCHARGE:  01/01/2004                                 DISCHARGE SUMMARY   FINAL DIAGNOSES:  1.  Chest pain, noncardiac.  2.  Most likely gastrointestinal related chest pain.  3.  Elevated liver enzymes.   SECONDARY DIAGNOSES:  1.  Hyperlipidemia.  2.  Gastroesophageal reflux disease.   BRIEF HISTORY:  [name] is a 49 year old African American female with  no significant past medical history other than hyperlipidemia.  She does  have family history of coronary artery disease.  She presented with chest  pain which was sternal.  It was relieved by two aspirin and she was admitted  to the hospital to evaluate for chest pain.   PHYSICAL EXAMINATION:  VITAL SIGNS:  On initial admission, blood pressure  102/115, pulse rate of 90s to 120s, sinus tachycardia, respiratory rate 14.  O2 saturation 98% on room air.  GENERAL:  Rest of the physical exam was benign.  All systems were within  normal limits.   LABORATORY DATA:  Pertinent laboratory data showed liver function tests with  an AST of 65, ALT 105.  Total protein 5.9, albumin 3.3, calcium 8.5, total  bilirubin 0.7.  LFTs on discharge showed AST of 23 and ALT of 58.  There is  improvement.  Troponin negative.  Serial cardiac enzymes x 3 negative.  Fasting lipid profile showed a total cholesterol of 156, triglycerides of  140, HDL 29, LDL 89.  VLDL 28.  Thyroid function test 1.955.   EKG showed normal sinus rhythm.  No ST elevation.   HOSPITAL COURSE:  The patient was admitted for apical chest pain.  However,  given the history of hyperlipidemia and family history of CAD, it was found  prudent to do a cardiac stress test after she ruled out for  myocardial  infarction.  Stress test was done by [facility] and the patient was  evaluated by Dr. Pricilla Riffle.  Stress test showed no wall motion  abnormality.  EF was 82%.  No infarction or ischemia.  There was mild apical  thinning which is a normal variance.   The patient's chest pain resolved within four hours and she was continued on  Protonix.  It was felt that this is probably GI related and she was asked to  continue with Nexium at home.  If the pain persists, perhaps she would  benefit from having an upper endoscopy.   She did have an ultrasound of the liver to evaluate the elevated liver  enzymes and there were no acute findings.  The gallbladder and biliary tree  were normal, common bile duct of 4.4 mm.  Liver, spleen and pancreas were  within normal limits.  Elevated liver enzymes were thought to be secondary  to statin and the patient is currently on  Vytorin.  This was discontinued  and she was advised to follow up with Dr. Talmadge Coventry in two to three  weeks time.  She would do a hepatic profile to send to Dr. Talmadge Coventry and I will defer the decision to statin to Dr. Talmadge Coventry.  In any case, lipid profile is currently within normal limits.   The patient was noted to have elevated blood pressure and also tachycardia.  During the course of hospitalization, she remained in normal sinus rhythm  with episodes of tachycardia.  However, this resolved.  It is of note, the  patient was using Phentermine for weight loss.  She had the last dose  sometime in June or July 2005.  She was consulted regarding stopping  Phentermine.   The patient was discharged in stable condition and she was instructed to  discontinue Vytorin until she sees primary care physician.  She is to  continue with other home medications.  She is to do a hepatic profile in two  weeks times and follow up with Dr. Talmadge Coventry in the office in two  to three weeks  time.        ___________________________________________  Cyndee Brightly Corky Downs, M.D.    MBB/MEDQ  D:  01/01/2004  T:  01/02/2004  Job:  [id number]   cc:   Talmadge Coventry, M.D.  [address]  [place]  [place] [address]  Fax: [phone]

## 2010-08-23 ENCOUNTER — Encounter: Payer: Self-pay | Admitting: Family Medicine

## 2010-08-23 ENCOUNTER — Telehealth: Payer: Self-pay | Admitting: Family Medicine

## 2010-08-23 NOTE — Telephone Encounter (Signed)
Pls make sure pt has been scheduled for a bilateral diagnostic mammogram [facility] [place].  If she needs me to put in the order, I will.

## 2010-08-24 NOTE — Telephone Encounter (Signed)
LMOM informing Pt of the above 

## 2010-08-30 ENCOUNTER — Telehealth: Payer: Self-pay | Admitting: Family Medicine

## 2010-08-30 NOTE — Telephone Encounter (Signed)
Pls let pt know that her bilateral breast u/s came back normal but she needs to have f/u diagnostic mammogram in 6 mos.

## 2010-08-30 NOTE — Telephone Encounter (Signed)
LMOM informing Pt of the above 

## 2010-09-16 ENCOUNTER — Encounter: Payer: Self-pay | Admitting: Family Medicine

## 2010-09-20 ENCOUNTER — Encounter: Payer: Self-pay | Admitting: Family Medicine

## 2010-09-20 ENCOUNTER — Ambulatory Visit (INDEPENDENT_AMBULATORY_CARE_PROVIDER_SITE_OTHER): Payer: 59 | Admitting: Family Medicine

## 2010-09-20 VITALS — BP 122/78 | HR 83 | Temp 99.5°F | Ht 62.0 in | Wt 182.0 lb

## 2010-09-20 DIAGNOSIS — J020 Streptococcal pharyngitis: Secondary | ICD-10-CM

## 2010-09-20 LAB — POCT RAPID STREP A (OFFICE): Rapid Strep A Screen: NEGATIVE

## 2010-09-20 MED ORDER — AMOXICILLIN 500 MG PO CAPS: 500.0000 mg | ORAL_CAPSULE | Freq: Three times a day (TID) | ORAL | Status: AC

## 2010-09-20 NOTE — Patient Instructions (Signed)
Rapid STREP neg, but will this is likely to be a false negative.  Will treat with Amoxicillin 500 mg 3 x a day with food x 10 day. Clear fluids, ibuprofen or naproxen for pain.  Call if not resolving in the next week.

## 2010-09-20 NOTE — Progress Notes (Signed)
  Subjective:    Patient ID: [name], female    DOB: August 30, 1960, 50 y.o.   MRN: [id number]  HPI  50 yo AAF presents for a HA and sore throat that started yesterday.  No fevers.  No runny nose or cough.  Sinus pressure.  Took Naproxen this AM which helped some.  Feels tired and achey.  No known exposure to strep.  No GI upset.  Able to eat and drink.    BP 122/78  Pulse 83  Temp(Src) 99.5 F (37.5 C) (Oral)  Ht 5\' 2"  (1.575 m)  Wt 182 lb (82.555 kg)  BMI 33.29 kg/m2  SpO2 99%  LMP 09/17/2010   Review of Systems  Respiratory: Negative for cough.        Objective:   Physical Exam  Constitutional: She appears well-developed and well-nourished.  HENT:  Right Ear: Tympanic membrane normal.  Left Ear: Tympanic membrane normal.  Nose: No mucosal edema or rhinorrhea. Right sinus exhibits no maxillary sinus tenderness and no frontal sinus tenderness. Left sinus exhibits no maxillary sinus tenderness and no frontal sinus tenderness.  Mouth/Throat: Posterior oropharyngeal edema (2+ bilat tonsilar hypertrophy) and posterior oropharyngeal erythema present. No oropharyngeal exudate or tonsillar abscesses.  Eyes: Conjunctivae are normal.  Neck: Neck supple.  Cardiovascular: Normal rate, regular rhythm and normal heart sounds.   Pulmonary/Chest: Effort normal and breath sounds normal.  Lymphadenopathy:    She has cervical adenopathy.  Skin: Skin is warm and dry.          Assessment & Plan:

## 2010-09-20 NOTE — Assessment & Plan Note (Signed)
Rapid Strep neg but likely to be  False neg.  Will cover her with Amoxicillin 500 mg tid x 10 days.  Out of work today and tomorrow.  Rest, hydrate and clear liquids.  NSAIDs for aches/ pains/ fevers.  Call if not improving in 7 days.

## 2010-10-13 ENCOUNTER — Other Ambulatory Visit: Payer: Self-pay | Admitting: *Deleted

## 2010-10-13 DIAGNOSIS — K219 Gastro-esophageal reflux disease without esophagitis: Secondary | ICD-10-CM

## 2010-10-13 MED ORDER — ESOMEPRAZOLE MAGNESIUM 40 MG PO CPDR: 40.0000 mg | DELAYED_RELEASE_CAPSULE | Freq: Every day | ORAL | Status: DC

## 2010-11-10 ENCOUNTER — Other Ambulatory Visit: Payer: Self-pay | Admitting: *Deleted

## 2010-11-10 DIAGNOSIS — J302 Other seasonal allergic rhinitis: Secondary | ICD-10-CM

## 2010-11-10 DIAGNOSIS — E785 Hyperlipidemia, unspecified: Secondary | ICD-10-CM

## 2010-11-10 MED ORDER — ROSUVASTATIN CALCIUM 20 MG PO TABS: 20.0000 mg | ORAL_TABLET | Freq: Every day | ORAL | Status: DC

## 2011-02-06 ENCOUNTER — Other Ambulatory Visit: Payer: Self-pay | Admitting: *Deleted

## 2011-02-06 DIAGNOSIS — K219 Gastro-esophageal reflux disease without esophagitis: Secondary | ICD-10-CM

## 2011-02-06 MED ORDER — ESOMEPRAZOLE MAGNESIUM 40 MG PO CPDR: 40.0000 mg | DELAYED_RELEASE_CAPSULE | Freq: Every day | ORAL | Status: DC

## 2011-02-09 ENCOUNTER — Other Ambulatory Visit: Payer: Self-pay | Admitting: *Deleted

## 2011-02-09 MED ORDER — MONTELUKAST SODIUM 10 MG PO TABS: 10.0000 mg | ORAL_TABLET | Freq: Every day | ORAL | Status: DC

## 2011-03-06 ENCOUNTER — Other Ambulatory Visit: Payer: Self-pay | Admitting: Family Medicine

## 2011-05-05 ENCOUNTER — Encounter: Payer: Self-pay | Admitting: *Deleted

## 2011-05-09 ENCOUNTER — Ambulatory Visit (INDEPENDENT_AMBULATORY_CARE_PROVIDER_SITE_OTHER): Payer: 59 | Admitting: Family Medicine

## 2011-05-09 ENCOUNTER — Encounter: Payer: Self-pay | Admitting: Family Medicine

## 2011-05-09 VITALS — BP 122/72 | HR 69 | Ht 63.0 in | Wt 184.0 lb

## 2011-05-09 DIAGNOSIS — E785 Hyperlipidemia, unspecified: Secondary | ICD-10-CM

## 2011-05-09 DIAGNOSIS — R928 Other abnormal and inconclusive findings on diagnostic imaging of breast: Secondary | ICD-10-CM

## 2011-05-09 DIAGNOSIS — Z0000 Encounter for general adult medical examination without abnormal findings: Secondary | ICD-10-CM

## 2011-05-09 LAB — COMPLETE METABOLIC PANEL WITH GFR
AST: 20 U/L (ref 0–37)
Albumin: 4.4 g/dL (ref 3.5–5.2)
Alkaline Phosphatase: 53 U/L (ref 39–117)
Potassium: 4.1 mEq/L (ref 3.5–5.3)
Sodium: 140 mEq/L (ref 135–145)
Total Bilirubin: 0.7 mg/dL (ref 0.3–1.2)
Total Protein: 7.1 g/dL (ref 6.0–8.3)

## 2011-05-09 LAB — LIPID PANEL
LDL Cholesterol: 108 mg/dL — ABNORMAL HIGH (ref 0–99)
Total CHOL/HDL Ratio: 4.5 Ratio
VLDL: 39 mg/dL (ref 0–40)

## 2011-05-09 MED ORDER — ALBUTEROL SULFATE HFA 108 (90 BASE) MCG/ACT IN AERS: 2.0000 | INHALATION_SPRAY | Freq: Four times a day (QID) | RESPIRATORY_TRACT | Status: DC | PRN

## 2011-05-09 MED ORDER — BECLOMETHASONE DIPROPIONATE 40 MCG/ACT IN AERS: 2.0000 | INHALATION_SPRAY | Freq: Two times a day (BID) | RESPIRATORY_TRACT | Status: DC

## 2011-05-09 NOTE — Progress Notes (Signed)
  Subjective:     Kyri Shader is a 51 y.o. female and is here for a comprehensive physical exam. The patient reports no problems.  History   Social History  . Marital Status: Divorced    Spouse Name: N/A    Number of Children: 0  . Years of Education: N/A   Occupational History  .  [employer]   Social History Main Topics  . Smoking status: Never Smoker   . Smokeless tobacco: Not on file  . Alcohol Use: Yes     Rarley.   . Drug Use: No  . Sexually Active: No   Other Topics Concern  . Not on file   Social History Narrative   Some exercise, walking.     Health Maintenance  Topic Date Due  . Influenza Vaccine  12/12/2011  . Mammogram  08/29/2012  . Pap Smear  05/12/2013  . Tetanus/tdap  03/13/2016  . Colonoscopy  03/13/2020    The following portions of the patient's history were reviewed and updated as appropriate: allergies, current medications, past family history, past medical history, past social history, past surgical history and problem list.  Review of Systems A comprehensive review of systems was negative.   Objective:    BP 122/72  Pulse 69  Ht 5\' 3"  (1.6 m)  Wt 184 lb (83.462 kg)  BMI 32.59 kg/m2  PF 270 L/min General appearance: alert, cooperative and appears stated age Head: Normocephalic, without obvious abnormality, atraumatic Eyes: conj clear, EOMI, PEERLA Ears: normal TM's and external ear canals both ears Nose: Nares normal. Septum midline. Mucosa normal. No drainage or sinus tenderness. Throat: lips, mucosa, and tongue normal; teeth and gums normal Neck: no adenopathy, no carotid bruit, no JVD, supple, symmetrical, trachea midline and thyroid not enlarged, symmetric, no tenderness/mass/nodules Back: symmetric, no curvature. ROM normal. No CVA tenderness. Lungs: clear to auscultation bilaterally Heart: regular rate and rhythm, S1, S2 normal, no murmur, click, rub or gallop Abdomen: soft, non-tender; bowel sounds normal; no masses,  no  organomegaly Extremities: extremities normal, atraumatic, no cyanosis or edema Pulses: 2+ and symmetric  Neuro: Reflexes 2+ in UE and LE.    Assessment:    Healthy female exam.      Plan:     See After Visit Summary for Counseling Recommendations  Start a regular exercise program and make sure you are eating a healthy diet Try to eat 4 servings of dairy a day or take a calcium supplement (500mg  twice a day). Your vaccines are up to date.  Due for screening labs. Vaccines are up to date Due for her 6 month f/u diagnostic mammo. Will schedule at [facility].

## 2011-05-09 NOTE — Patient Instructions (Signed)
Start a regular exercise program and make sure you are eating a healthy diet Try to eat 4 servings of dairy a day or take a calcium supplement (500mg twice a day). Your vaccines are up to date.   

## 2011-05-18 ENCOUNTER — Telehealth: Payer: Self-pay | Admitting: *Deleted

## 2011-05-18 ENCOUNTER — Encounter: Payer: Self-pay | Admitting: *Deleted

## 2011-05-18 MED ORDER — FENOFIBRATE 145 MG PO TABS: 145.0000 mg | ORAL_TABLET | Freq: Every day | ORAL | Status: DC

## 2011-05-18 NOTE — Telephone Encounter (Signed)
LM on VM.

## 2011-05-18 NOTE — Telephone Encounter (Signed)
There is already an ordre in the system, place in Feb. If they haven't contacted her then lets try to call [facility] [facility] and find out when they are scheduling her.  Rx sent for tricor

## 2011-05-18 NOTE — Telephone Encounter (Signed)
Pt called to get lab results and states that she will try the Tricor you mentioned to add. States also that in the last 3 weeks that she has cut back on fatty foods. Pt needs a referral to have the additional images needed following her mammogram.

## 2011-05-22 ENCOUNTER — Telehealth: Payer: Self-pay | Admitting: Family Medicine

## 2011-05-22 MED ORDER — FENOFIBRATE 150 MG PO CAPS: 1.0000 | ORAL_CAPSULE | Freq: Every day | ORAL | Status: DC

## 2011-05-22 NOTE — Telephone Encounter (Signed)
Pt informed

## 2011-05-22 NOTE — Telephone Encounter (Signed)
Per insurance will change the fenofibrate to lipofen.

## 2011-06-07 ENCOUNTER — Other Ambulatory Visit: Payer: Self-pay | Admitting: Family Medicine

## 2011-07-07 ENCOUNTER — Ambulatory Visit
Admission: RE | Admit: 2011-07-07 | Discharge: 2011-07-07 | Disposition: A | Discharge status: Home / Self Care | Payer: 59 | Source: Ambulatory Visit | Attending: Family Medicine | Admitting: Family Medicine

## 2011-07-07 ENCOUNTER — Other Ambulatory Visit: Payer: Self-pay | Admitting: Family Medicine

## 2011-07-07 DIAGNOSIS — R928 Other abnormal and inconclusive findings on diagnostic imaging of breast: Secondary | ICD-10-CM

## 2011-07-11 ENCOUNTER — Telehealth: Payer: Self-pay | Admitting: *Deleted

## 2011-07-11 MED ORDER — NIACIN ER (ANTIHYPERLIPIDEMIC) 500 MG PO TBCR: 500.0000 mg | EXTENDED_RELEASE_TABLET | Freq: Every day | ORAL | Status: DC

## 2011-07-11 NOTE — Telephone Encounter (Signed)
Pt states she started taking the Lipofen she was prescribed but it caused her to have leg pain and HA. Pt states she quit taking med and would like to know if we can prescribe something else. Please advise.

## 2011-07-11 NOTE — Telephone Encounter (Signed)
LM for pt

## 2011-07-11 NOTE — Telephone Encounter (Signed)
Will send rx for niacin. Common to cause flushing.

## 2011-09-05 ENCOUNTER — Other Ambulatory Visit: Payer: Self-pay | Admitting: Family Medicine

## 2011-09-15 ENCOUNTER — Other Ambulatory Visit: Payer: Self-pay | Admitting: Family Medicine

## 2011-10-13 ENCOUNTER — Other Ambulatory Visit: Payer: Self-pay | Admitting: Family Medicine

## 2011-11-03 ENCOUNTER — Other Ambulatory Visit: Payer: Self-pay | Admitting: Family Medicine

## 2011-11-17 ENCOUNTER — Ambulatory Visit (INDEPENDENT_AMBULATORY_CARE_PROVIDER_SITE_OTHER): Payer: 59 | Admitting: Physician Assistant

## 2011-11-17 ENCOUNTER — Encounter: Payer: Self-pay | Admitting: Physician Assistant

## 2011-11-17 VITALS — BP 141/95 | HR 99 | Temp 98.0°F | Ht 63.0 in | Wt 168.0 lb

## 2011-11-17 DIAGNOSIS — H6692 Otitis media, unspecified, left ear: Secondary | ICD-10-CM

## 2011-11-17 DIAGNOSIS — H6120 Impacted cerumen, unspecified ear: Secondary | ICD-10-CM

## 2011-11-17 DIAGNOSIS — H6690 Otitis media, unspecified, unspecified ear: Secondary | ICD-10-CM

## 2011-11-17 DIAGNOSIS — R030 Elevated blood-pressure reading, without diagnosis of hypertension: Secondary | ICD-10-CM

## 2011-11-17 DIAGNOSIS — H6123 Impacted cerumen, bilateral: Secondary | ICD-10-CM

## 2011-11-17 MED ORDER — AMOXICILLIN-POT CLAVULANATE 875-125 MG PO TABS: 1.0000 | ORAL_TABLET | Freq: Two times a day (BID) | ORAL | Status: AC

## 2011-11-17 NOTE — Progress Notes (Signed)
  Subjective:    Patient ID: Elizabeth Dawson, female    DOB: 04/29/1960, 51 y.o.   MRN: [id number]  HPI Patient is a 51 yo female who presents to the clinic with left ear pain x1 day. She has felt bad for over one week. She has had sore throat, sinus pressure, drainage in the back of her throat, dry cough, and headache for over a week. Yesterday everything got much worse. She feels like her left ear is plugged and has been popping. Pain is a 7/10 in left ear only. She took Benadryl and 2 aleve and it did help. She has also used a nettie pot and it has been successful. She denies any fever, but reports she has felt hot, chills, muscle ache. Her BP is elevated today. SHe has not had a problem with this in the past. She denies any CP, palpitations, SOB. She denies any usage of sudafed.    Review of Systems     Objective:   Physical Exam  Constitutional: She is oriented to person, place, and time. She appears well-developed and well-nourished.  HENT:  Head: Normocephalic and atraumatic.       Bilateral ear cerumen impaction.   After bilateral ears cleaned: TM normal in right ear. TM bulging and dull in left ear. Ossicles are not visible. Appears like there is blood behind TM. No pus is present.  Negative for maxillary or frontal tenderness.   Cardiovascular: Normal rate, regular rhythm and normal heart sounds.   Pulmonary/Chest: Effort normal and breath sounds normal. She has no wheezes.  Neurological: She is alert and oriented to person, place, and time.  Skin: Skin is warm and dry.  Psychiatric: She has a normal mood and affect. Her behavior is normal.          Assessment & Plan:  Left ear otitis media- Gave Augmentin for 10 days. Gave H.O for ear infection. Encouraged pt to continue to use nettie pot. Aleve for pain. Stay hydrated and use flonase regularly.  Cerumen impaction- Nurse irrigated ears bilaterally with success. Gross hearing returned after removal.  Elevated blood  pressure- Pt has not had hx of BP elevation. I discussed with patient that this could be due to pain/discomfort in the ear. I did talk about weight loss, limiting salt, regular exercise to help lower BP. She agreed to check BP regularly at pharmacy and make sure staying in the 120's over 80's.

## 2011-11-17 NOTE — Patient Instructions (Addendum)
Start Augmentin twice a day for 10 days.  Otitis Media, Adult A middle ear infection is an infection in the space behind the eardrum. The medical name for this is "otitis media." It may happen after a common cold. It is caused by a germ that starts growing in that space. You may feel swollen glands in your neck on the side of the ear infection. HOME CARE INSTRUCTIONS   Take your medicine as directed until it is gone, even if you feel better after the first few days.   Only take over-the-counter or prescription medicines for pain, discomfort, or fever as directed by your caregiver.   Occasional use of a nasal decongestant a couple times per day may help with discomfort and help the eustachian tube to drain better.  Follow up with your caregiver in 10 to 14 days or as directed, to be certain that the infection has cleared. Not keeping the appointment could result in a chronic or permanent injury, pain, hearing loss and disability. If there is any problem keeping the appointment, you must call back to this facility for assistance. SEEK IMMEDIATE MEDICAL CARE IF:   You are not getting better in 2 to 3 days.   You have pain that is not controlled with medication.   You feel worse instead of better.   You cannot use the medication as directed.   You develop swelling, redness or pain around the ear or stiffness in your neck.  MAKE SURE YOU:   Understand these instructions.   Will watch your condition.   Will get help right away if you are not doing well or get worse.  Document Released: 12/03/2003 Document Revised: 02/16/2011 Document Reviewed: 10/04/2007 [facility] Patient Information 2012 [place], [place].

## 2011-11-27 ENCOUNTER — Encounter: Payer: Self-pay | Admitting: Physician Assistant

## 2011-11-27 ENCOUNTER — Ambulatory Visit (INDEPENDENT_AMBULATORY_CARE_PROVIDER_SITE_OTHER): Payer: 59 | Admitting: Physician Assistant

## 2011-11-27 VITALS — BP 129/81 | HR 96 | Temp 98.3°F

## 2011-11-27 DIAGNOSIS — H9319 Tinnitus, unspecified ear: Secondary | ICD-10-CM

## 2011-11-27 DIAGNOSIS — H9190 Unspecified hearing loss, unspecified ear: Secondary | ICD-10-CM

## 2011-11-27 MED ORDER — CIPROFLOXACIN-DEXAMETHASONE 0.3-0.1 % OT SUSP: 4.0000 [drp] | Freq: Two times a day (BID) | OTIC | Status: DC

## 2011-11-27 NOTE — Progress Notes (Signed)
  Subjective:    Patient ID: Elizabeth Dawson, female    DOB: [birth year], 51 y.o.   MRN: [id number]  HPI Patient presents to the clinic with left ear ringing and hearing loss. She was seen earlier in September for otitis media and treated with Augmentin and her ears were irrigated due to accumulation of cerumen. Treatment did help the pain but her ear still feels clogged. She is having trouble hearing out of her left ear and there is a constant buzzing. She has to take a benadryl at night to relax her enough to go to sleep because of the buzzing. She denies any pain or drainage. She does have some congestion and drainage on the back of her throat. She has never had anything like this before. She does feel a little dizzy sometimes but not disabling. She denies any fever, chills, or sinus pressure.   Review of Systems     Objective:   Physical Exam  Constitutional: She is oriented to person, place, and time. She appears well-developed and well-nourished.  HENT:  Head: Normocephalic and atraumatic.  Right Ear: External ear normal.  Nose: Nose normal.  Mouth/Throat: Oropharynx is clear and moist. No oropharyngeal exudate.       Left external ear tender with pull of tragus. TM visible but erythematous in the canal and actual TM looks very cloudy and budging at the base of TM. Negative for any blood or pus. Gross hearing loss in left ear. No hearing detected with audiometry.  Right TM normal. Hearing normal. Normal audiometry.  Eyes: Conjunctivae normal are normal.  Neck: Normal range of motion. Neck supple.  Cardiovascular: Normal rate, regular rhythm and normal heart sounds.   Pulmonary/Chest: Effort normal and breath sounds normal. She has no wheezes.  Lymphadenopathy:    She has no cervical adenopathy.  Neurological: She is alert and oriented to person, place, and time.  Skin: Skin is warm and dry.  Psychiatric: She has a normal mood and affect. Her behavior is normal.            Assessment & Plan:  Left ear hearing loss/tinnitus/External otitis externa- Did give Ciprodex to use for next 7 days. With her current symptoms I think she needs to be evaluated by ENT for possible menieres disease. I did give pt hand out for menieres and did tell her to stay away from salt. Will refer. Call if not called with appt in next week.

## 2011-11-27 NOTE — Patient Instructions (Addendum)
Meniere's Disease You have Meniere's disease. Meniere's disease is a term for the recurrent symptoms (problems) of vertigo (the room or you seem to spin around), tinnitus (ringing in the ears), and hearing loss. The cause is unknown. These episodes often come on suddenly and without warning. They are sometimes associated with nausea (feeling sick to your stomach) and vomiting. There is no cure. A number of strategies may help the symptoms. Surgical help is available if conservative measures fail. HOME CARE INSTRUCTIONS   If you smoke, STOP.   Restrict your salt intake.   Stop caffeine consumption (caffeinated coffee, sodas, and chocolate).   Do not drive during or near the time of attacks.   Over the counter Antivert (meclizine) at a 25 mg dosage 3 times per day may be helpful.  SEEK IMMEDIATE MEDICAL CARE IF:   Nausea and vomiting are continuous.   You have no relief from vertigo.  MAKE SURE YOU:   Understand these instructions.   Will watch your condition.   Will get help right away if you are not doing well or get worse.  Document Released: 02/25/2000 Document Revised: 02/16/2011 Document Reviewed: 02/27/2005 [employer] Patient Information 2012 [employer]. 

## 2011-12-05 ENCOUNTER — Other Ambulatory Visit: Payer: Self-pay | Admitting: Family Medicine

## 2011-12-05 DIAGNOSIS — N63 Unspecified lump in unspecified breast: Secondary | ICD-10-CM

## 2011-12-06 ENCOUNTER — Encounter: Payer: Self-pay | Admitting: Physician Assistant

## 2011-12-08 ENCOUNTER — Telehealth: Payer: Self-pay | Admitting: *Deleted

## 2011-12-08 NOTE — Telephone Encounter (Signed)
Pt called stating she had got and ENT appt with [facility] for 12/12/11 with Dr. Jearld Fenton so she won't be going to [facility].

## 2012-01-05 ENCOUNTER — Ambulatory Visit
Admission: RE | Admit: 2012-01-05 | Discharge: 2012-01-05 | Disposition: A | Discharge status: Home / Self Care | Payer: 59 | Source: Ambulatory Visit | Attending: Family Medicine | Admitting: Family Medicine

## 2012-01-05 ENCOUNTER — Other Ambulatory Visit: Payer: Self-pay | Admitting: Family Medicine

## 2012-01-05 DIAGNOSIS — N63 Unspecified lump in unspecified breast: Secondary | ICD-10-CM

## 2012-01-06 ENCOUNTER — Other Ambulatory Visit: Payer: Self-pay | Admitting: Family Medicine

## 2012-01-11 ENCOUNTER — Ambulatory Visit
Admission: RE | Admit: 2012-01-11 | Discharge: 2012-01-11 | Disposition: A | Discharge status: Home / Self Care | Payer: 59 | Source: Ambulatory Visit | Attending: Family Medicine | Admitting: Family Medicine

## 2012-01-11 ENCOUNTER — Other Ambulatory Visit: Payer: Self-pay | Admitting: Family Medicine

## 2012-01-11 DIAGNOSIS — N63 Unspecified lump in unspecified breast: Secondary | ICD-10-CM

## 2012-03-04 ENCOUNTER — Other Ambulatory Visit: Payer: Self-pay | Admitting: Family Medicine

## 2012-04-01 ENCOUNTER — Other Ambulatory Visit: Payer: Self-pay | Admitting: Family Medicine

## 2012-04-13 ENCOUNTER — Other Ambulatory Visit: Payer: Self-pay | Admitting: Family Medicine

## 2012-04-27 ENCOUNTER — Other Ambulatory Visit: Payer: Self-pay

## 2012-04-30 ENCOUNTER — Other Ambulatory Visit: Payer: Self-pay | Admitting: Physician Assistant

## 2012-05-30 ENCOUNTER — Encounter: Payer: Self-pay | Admitting: Internal Medicine

## 2012-05-30 ENCOUNTER — Ambulatory Visit (INDEPENDENT_AMBULATORY_CARE_PROVIDER_SITE_OTHER): Payer: 59 | Admitting: Internal Medicine

## 2012-05-30 VITALS — BP 148/94 | HR 97 | Temp 97.7°F | Resp 18 | Ht 63.0 in | Wt 189.0 lb

## 2012-05-30 DIAGNOSIS — R030 Elevated blood-pressure reading, without diagnosis of hypertension: Secondary | ICD-10-CM

## 2012-05-30 DIAGNOSIS — E669 Obesity, unspecified: Secondary | ICD-10-CM

## 2012-05-30 DIAGNOSIS — E785 Hyperlipidemia, unspecified: Secondary | ICD-10-CM

## 2012-05-30 DIAGNOSIS — G4733 Obstructive sleep apnea (adult) (pediatric): Secondary | ICD-10-CM | POA: Insufficient documentation

## 2012-05-30 DIAGNOSIS — J45909 Unspecified asthma, uncomplicated: Secondary | ICD-10-CM

## 2012-05-30 DIAGNOSIS — E559 Vitamin D deficiency, unspecified: Secondary | ICD-10-CM

## 2012-05-30 LAB — CBC WITH DIFFERENTIAL/PLATELET
Basophils Relative: 0 % (ref 0–1)
HCT: 39.1 % (ref 36.0–46.0)
Hemoglobin: 13.1 g/dL (ref 12.0–15.0)
Lymphocytes Relative: 39 % (ref 12–46)
Monocytes Absolute: 0.4 10*3/uL (ref 0.1–1.0)
Monocytes Relative: 6 % (ref 3–12)
Neutro Abs: 2.8 10*3/uL (ref 1.7–7.7)
Neutrophils Relative %: 53 % (ref 43–77)
RBC: 4.78 MIL/uL (ref 3.87–5.11)
WBC: 5.5 10*3/uL (ref 4.0–10.5)

## 2012-05-30 LAB — COMPREHENSIVE METABOLIC PANEL
AST: 17 U/L (ref 0–37)
Albumin: 4.5 g/dL (ref 3.5–5.2)
Alkaline Phosphatase: 57 U/L (ref 39–117)
BUN: 16 mg/dL (ref 6–23)
Calcium: 9.6 mg/dL (ref 8.4–10.5)
Chloride: 105 mEq/L (ref 96–112)
Glucose, Bld: 112 mg/dL — ABNORMAL HIGH (ref 70–99)
Potassium: 4.4 mEq/L (ref 3.5–5.3)
Sodium: 142 mEq/L (ref 135–145)
Total Protein: 7 g/dL (ref 6.0–8.3)

## 2012-05-30 LAB — TSH: TSH: 1.873 u[IU]/mL (ref 0.350–4.500)

## 2012-05-30 LAB — LIPID PANEL
HDL: 48 mg/dL (ref >39)
LDL Cholesterol: 153 mg/dL — ABNORMAL HIGH (ref 0–99)
Triglycerides: 264 mg/dL — ABNORMAL HIGH (ref <150)

## 2012-05-30 MED ORDER — ROSUVASTATIN CALCIUM 20 MG PO TABS: 20.0000 mg | ORAL_TABLET | Freq: Every day | ORAL | Status: DC

## 2012-05-30 NOTE — Progress Notes (Signed)
  Subjective:    Patient ID: Elizabeth Dawson, female    DOB: Feb 14, 1961, 52 y.o.   MRN: [id number]  HPI  New pt here for first visit.  Former care Dr. Cathey Endow,  Dr. Eppie Gibson at [place].    PMH of abnormal mammogram 2/13 with negative aspiration of R breast Dr. Si Gaul (per Pt),  GERD,  Hyperlipidemia, Migraine headaches,  OSA S/P uvuloplasty, and esophageal spasm.   See BP  Long FH of htn  Pt asymptomatic  She is out of Crestor and needs a refill  She reports migraines peri-menstrually.   She uses Excedrin migraine  And this seems to help  She does reports her menses have just started to become irregular.  She has menses 2/23 and 10 days later had bleeding.  This is the first month of irregularity.  Has history of cyst on L ovary.  NO abd pain.  She has tinnitus.  She does use Q-tip inside her ear.  Asthma  Last alubterol use last summer.    Using Singulair and Qvar.       She is also concerned about inability to lose weight    Review of Systems See HPI    Objective:   Physical Exam Physical Exam  Nursing note and vitals reviewed.  Constitutional: She is oriented to person, place, and time. She appears well-developed and well-nourished.  HENT:  Head: Normocephalic and atraumatic.  Tm's  Cerumen impaction on the Left.  R TM  Large amount of wax Cardiovascular: Normal rate and regular rhythm. Exam reveals no gallop and no friction rub.  No murmur heard.  Pulmonary/Chest: Breath sounds normal. She has no wheezes. She has no rales.  Neurological: She is alert and oriented to person, place, and time.  Skin: Skin is warm and dry.  Psychiatric: She has a normal mood and affect. Her behavior is normal.  Ext:  No edema            Assessment & Plan:  Hyperlipidemia  Will check today  Elevated BP  Pt to check BP 3 times per week as an outpt.  She is to see me in the next 7-10 days  Tinnitus   Has cerumen impaction on Left . Advised not to use q-tips.  Will schedule for ear  irrigation.  Irregular menses.  Likely anovulatory cycles but will check TSh.  If menses continue to be  heavy will need imaging  Allergic rhinitis continue meds  Asthma continue meds  Obesity:  Will refer to nutrition

## 2012-05-30 NOTE — Patient Instructions (Addendum)
See me in 7-10 days  

## 2012-05-31 ENCOUNTER — Telehealth: Payer: Self-pay | Admitting: *Deleted

## 2012-05-31 NOTE — Telephone Encounter (Signed)
Pt referral as well as OV labs and demographics faxed to [facility]  [facility]

## 2012-06-04 ENCOUNTER — Ambulatory Visit (INDEPENDENT_AMBULATORY_CARE_PROVIDER_SITE_OTHER): Payer: 59 | Admitting: Internal Medicine

## 2012-06-04 ENCOUNTER — Encounter: Payer: Self-pay | Admitting: Internal Medicine

## 2012-06-04 VITALS — BP 135/83 | HR 94 | Temp 97.3°F | Resp 18

## 2012-06-04 DIAGNOSIS — H6122 Impacted cerumen, left ear: Secondary | ICD-10-CM

## 2012-06-04 DIAGNOSIS — H6123 Impacted cerumen, bilateral: Secondary | ICD-10-CM

## 2012-06-04 DIAGNOSIS — H6120 Impacted cerumen, unspecified ear: Secondary | ICD-10-CM

## 2012-06-04 DIAGNOSIS — H9313 Tinnitus, bilateral: Secondary | ICD-10-CM | POA: Insufficient documentation

## 2012-06-04 NOTE — Patient Instructions (Addendum)
Keep follow up appt with me

## 2012-06-04 NOTE — Progress Notes (Signed)
Subjective:    Patient ID: Elizabeth Dawson, female    DOB: [birth year], 52 y.o.   MRN: [id number]  HPI  Elizabeth Dawson is here for cerumen plug left ear.  She has chronic tinnitus and has seen Dr. Jearld Fenton for this  She does use a Q-tip in canals  Allergies  Allergen Reactions  . Codeine   . Fenofibrate Other (See Comments)    Headache and leg pain   Past Medical History  Diagnosis Date  . Asthma   . Hyperlipidemia   . Premenopausal menorrhagia    Past Surgical History  Procedure Laterality Date  . Nasal septum surgery  1997    For sleep apnea  . Uvulopalatopharyngoplasty  1997    For sleep apnea   History   Social History  . Marital Status: Divorced    Spouse Name: N/A    Number of Children: 0  . Years of Education: N/A   Occupational History  .  [employer]   Social History Main Topics  . Smoking status: Never Smoker   . Smokeless tobacco: Not on file  . Alcohol Use: Yes     Comment: [place]arley.   . Drug Use: No  . Sexually Active: No   Other Topics Concern  . Not on file   Social History Narrative   Some exercise, walking.     Family History  Problem Relation Age of Onset  . Heart disease Mother   . Diabetes Mother   . Heart disease Father   . Mental illness Father     alzheimers   Patient Active Problem List  Diagnosis  . HYPERLIPIDEMIA  . ASTHMA  . UNSPECIFIED VITAMIN D DEFICIENCY  . OBESITY, UNSPECIFIED  . FATIGUE  . OSA (obstructive sleep apnea)  . Elevated blood pressure  . Tinnitus of both ears  . Cerumen impaction   Current Outpatient Prescriptions on File Prior to Visit  Medication Sig Dispense Refill  . aspirin 81 MG tablet Take 81 mg by mouth daily.      Marland Kitchen BIOTIN 5000 PO Take by mouth 2 (two) times daily.      . Cyanocobalamin (VITAMIN B-12 PO) Take by mouth.      . fluticasone (FLONASE) 50 MCG/ACT nasal spray Place 2 sprays into the nose daily.      Marland Kitchen GLUCOSAMINE HCL PO Take by mouth.      . montelukast (SINGULAIR) 10 MG tablet  TAKE ONE TABLET BY MOUTH NIGHTLY AT BEDTIME  30 tablet  3  . Multiple Vitamin (MULTIVITAMIN) tablet Take 1 tablet by mouth daily.      Marland Kitchen NEXIUM 40 MG capsule TAKE ONE CAPSULE BY MOUTH EVERY DAY BEFORE BREAKFAST  30 capsule  1  . Omega-3 Fatty Acids (FISH OIL PO) Take 1 tablet by mouth.      . rosuvastatin (CRESTOR) 20 MG tablet Take 1 tablet (20 mg total) by mouth daily.  30 tablet  0  . albuterol (VENTOLIN HFA) 108 (90 BASE) MCG/ACT inhaler Inhale 2 puffs into the lungs every 6 (six) hours as needed for wheezing or shortness of breath.  1 Inhaler  3  . beclomethasone (QVAR) 40 MCG/ACT inhaler Inhale 2 puffs into the lungs 2 (two) times daily.       No current facility-administered medications on file prior to visit.      Review of Systems    see HPI Objective:   Physical Exam Physical Exam  Nursing note and vitals reviewed.  Constitutional: She is oriented to  person, place, and time. She appears well-developed and well-nourished.  HENT:  Head: Normocephalic and atraumatic.  Tm's  After ear irigation  Good visualizaiton of both drums.  Canals clear Cardiovascular: Normal rate and regular rhythm. Exam reveals no gallop and no friction rub.  No murmur heard.  Pulmonary/Chest: Breath sounds normal. She has no wheezes. She has no rales.  Neurological: She is alert and oriented to person, place, and time.  Skin: Skin is warm and dry.  Psychiatric: She has a normal mood and affect. Her behavior is normal.    Some dizziness post ear irrigaiton.  Pt to lobby until symptoms resolve            Assessment & Plan:  Cerumen impaction  Ear irrigation today bilaterally  Dizziness post irrigation .

## 2012-06-06 ENCOUNTER — Ambulatory Visit (INDEPENDENT_AMBULATORY_CARE_PROVIDER_SITE_OTHER): Payer: 59 | Admitting: Internal Medicine

## 2012-06-06 ENCOUNTER — Encounter: Payer: Self-pay | Admitting: Internal Medicine

## 2012-06-06 VITALS — BP 136/86 | HR 98 | Temp 97.1°F | Resp 18 | Wt 187.0 lb

## 2012-06-06 DIAGNOSIS — H9313 Tinnitus, bilateral: Secondary | ICD-10-CM

## 2012-06-06 DIAGNOSIS — R030 Elevated blood-pressure reading, without diagnosis of hypertension: Secondary | ICD-10-CM

## 2012-06-06 DIAGNOSIS — J309 Allergic rhinitis, unspecified: Secondary | ICD-10-CM

## 2012-06-06 DIAGNOSIS — J45909 Unspecified asthma, uncomplicated: Secondary | ICD-10-CM

## 2012-06-06 DIAGNOSIS — IMO0001 Reserved for inherently not codable concepts without codable children: Secondary | ICD-10-CM

## 2012-06-06 DIAGNOSIS — H9319 Tinnitus, unspecified ear: Secondary | ICD-10-CM

## 2012-06-06 DIAGNOSIS — E785 Hyperlipidemia, unspecified: Secondary | ICD-10-CM

## 2012-06-06 MED ORDER — MONTELUKAST SODIUM 10 MG PO TABS: 10.0000 mg | ORAL_TABLET | Freq: Every day | ORAL | Status: DC

## 2012-06-06 MED ORDER — ESOMEPRAZOLE MAGNESIUM 40 MG PO CPDR: 40.0000 mg | DELAYED_RELEASE_CAPSULE | Freq: Every day | ORAL | Status: DC

## 2012-06-06 MED ORDER — FLUTICASONE PROPIONATE 50 MCG/ACT NA SUSP: 2.0000 | Freq: Every day | NASAL | Status: AC

## 2012-06-06 MED ORDER — BECLOMETHASONE DIPROPIONATE 40 MCG/ACT IN AERS: 2.0000 | INHALATION_SPRAY | Freq: Two times a day (BID) | RESPIRATORY_TRACT | Status: DC

## 2012-06-06 MED ORDER — ROSUVASTATIN CALCIUM 20 MG PO TABS: 20.0000 mg | ORAL_TABLET | Freq: Every day | ORAL | Status: DC

## 2012-06-06 NOTE — Patient Instructions (Addendum)
See me for CPE 

## 2012-06-06 NOTE — Progress Notes (Signed)
Subjective:    Patient ID: Elizabeth Dawson Sites, female    DOB: [birth year], 52 y.o.   MRN: [id number]  HPI  Elizabeth Dawson is here for follow up    She has restarted her Crestor  See lipids  She brings  Record of outpt Bp's  That range 134/85 and lowest 119/80  Allergies flaring up. Some sneezing  Eyes itchy  Allergies  Allergen Reactions  . Codeine   . Fenofibrate Other (See Comments)    Headache and leg pain   Past Medical History  Diagnosis Date  . Asthma   . Hyperlipidemia   . Premenopausal menorrhagia    Past Surgical History  Procedure Laterality Date  . Nasal septum surgery  1997    For sleep apnea  . Uvulopalatopharyngoplasty  1997    For sleep apnea   History   Social History  . Marital Status: Divorced    Spouse Name: N/A    Number of Children: 0  . Years of Education: N/A   Occupational History  .  [employer]   Social History Main Topics  . Smoking status: Never Smoker   . Smokeless tobacco: Not on file  . Alcohol Use: Yes     Comment: [place]arley.   . Drug Use: No  . Sexually Active: No   Other Topics Concern  . Not on file   Social History Narrative   Some exercise, walking.     Family History  Problem Relation Age of Onset  . Heart disease Mother   . Diabetes Mother   . Heart disease Father   . Mental illness Father     alzheimers   Patient Active Problem List  Diagnosis  . HYPERLIPIDEMIA  . ASTHMA  . UNSPECIFIED VITAMIN D DEFICIENCY  . OBESITY, UNSPECIFIED  . FATIGUE  . OSA (obstructive sleep apnea)  . Elevated blood pressure  . Tinnitus of both ears  . Cerumen impaction   Current Outpatient Prescriptions on File Prior to Visit  Medication Sig Dispense Refill  . aspirin 81 MG tablet Take 81 mg by mouth daily.      Marland Kitchen BIOTIN 5000 PO Take by mouth 2 (two) times daily.      . Cyanocobalamin (VITAMIN B-12 PO) Take by mouth.      Marland Kitchen GLUCOSAMINE HCL PO Take by mouth.      . Multiple Vitamin (MULTIVITAMIN) tablet Take 1 tablet by mouth  daily.      . Omega-3 Fatty Acids (FISH OIL PO) Take 1 tablet by mouth.      Marland Kitchen albuterol (VENTOLIN HFA) 108 (90 BASE) MCG/ACT inhaler Inhale 2 puffs into the lungs every 6 (six) hours as needed for wheezing or shortness of breath.  1 Inhaler  3  . beclomethasone (QVAR) 40 MCG/ACT inhaler Inhale 2 puffs into the lungs 2 (two) times daily.       No current facility-administered medications on file prior to visit.      Review of Systems    see HPI  Objective:   Physical Exam Physical Exam  Nursing note and vitals reviewed.  Constitutional: She is oriented to person, place, and time. She appears well-developed and well-nourished.  HENT:  Head: Normocephalic and atraumatic.  Cardiovascular: Normal rate and regular rhythm. Exam reveals no gallop and no friction rub.  No murmur heard.  Pulmonary/Chest: Breath sounds normal. She has no wheezes. She has no rales.  Neurological: She is alert and oriented to person, place, and time.  Skin: Skin is warm and  dry.  Psychiatric: She has a normal mood and affect. Her behavior is normal.          Assessment & Plan:  Elevated Bp  May be element of white coat HTN  Hyperlipidemia  On crestor now  Allergic rhinitis  Ok for flonase and sinulair  Asthma  Will re-order Qvar  Tinnitus   Managed by ENT

## 2012-06-09 ENCOUNTER — Encounter: Payer: Self-pay | Admitting: Emergency Medicine

## 2012-06-09 ENCOUNTER — Emergency Department
Admission: EM | Admit: 2012-06-09 | Discharge: 2012-06-09 | Disposition: A | Discharge status: Home / Self Care | Payer: 59 | Source: Home / Self Care | Attending: Family Medicine | Admitting: Family Medicine

## 2012-06-09 DIAGNOSIS — J069 Acute upper respiratory infection, unspecified: Secondary | ICD-10-CM

## 2012-06-09 HISTORY — DX: Sleep apnea, unspecified: G47.30

## 2012-06-09 LAB — POCT RAPID STREP A (OFFICE): Rapid Strep A Screen: NEGATIVE

## 2012-06-09 MED ORDER — AMOXICILLIN 875 MG PO TABS: 875.0000 mg | ORAL_TABLET | Freq: Two times a day (BID) | ORAL | Status: DC

## 2012-06-09 MED ORDER — BENZONATATE 200 MG PO CAPS: 200.0000 mg | ORAL_CAPSULE | Freq: Every day | ORAL | Status: DC

## 2012-06-09 MED ORDER — PREDNISONE 20 MG PO TABS: 20.0000 mg | ORAL_TABLET | Freq: Two times a day (BID) | ORAL | Status: DC

## 2012-06-09 NOTE — ED Provider Notes (Signed)
History     CSN: [id number]  Arrival date & time 06/09/12  1133   First MD Initiated Contact with Patient 06/09/12 1224      Chief Complaint  Patient presents with  . Headache  . Nasal Congestion      HPI Comments: Patient noticed that her ears felt clogged 5 days ago and eyes were somewhat red.  Two days ago she developed sore throat, headache, fatigue, and sinus congestion.  Last night she had chills.  She feels tight in her anterior chest but does not have a cough.  No wheezing or shortness of breath.  The history is provided by the patient.    Past Medical History  Diagnosis Date  . Asthma   . Hyperlipidemia   . Premenopausal menorrhagia   . Sleep apnea     Past Surgical History  Procedure Laterality Date  . Nasal septum surgery  1997    For sleep apnea  . Uvulopalatopharyngoplasty  1997    For sleep apnea    Family History  Problem Relation Age of Onset  . Heart disease Mother   . Diabetes Mother   . Heart disease Father   . Mental illness Father     alzheimers    History  Substance Use Topics  . Smoking status: Never Smoker   . Smokeless tobacco: Not on file  . Alcohol Use: Yes     Comment: Rarley.     OB History   Grav Para Term Preterm Abortions TAB SAB Ect Mult Living                  Review of Systems + sore throat No cough No pleuritic pain No wheezing + nasal congestion + post-nasal drainage No sinus pain/pressure + itchy/red eyes ? earache No hemoptysis No SOB No fever, + chills No nausea No vomiting No abdominal pain No diarrhea No urinary symptoms No skin rashes + fatigue No myalgias + headache Used OTC meds without relief  Allergies  Codeine and Fenofibrate  Home Medications   Current Outpatient Rx  Name  Route  Sig  Dispense  Refill  . EXPIRED: albuterol (VENTOLIN HFA) 108 (90 BASE) MCG/ACT inhaler   Inhalation   Inhale 2 puffs into the lungs every 6 (six) hours as needed for wheezing or shortness of breath.    1 Inhaler   3   . amoxicillin (AMOXIL) 875 MG tablet   Oral   Take 1 tablet (875 mg total) by mouth 2 (two) times daily. (Rx void after 06/16/12)   20 tablet   0   . aspirin 81 MG tablet   Oral   Take 81 mg by mouth daily.         Marland Kitchen EXPIRED: beclomethasone (QVAR) 40 MCG/ACT inhaler   Inhalation   Inhale 2 puffs into the lungs 2 (two) times daily.         . beclomethasone (QVAR) 40 MCG/ACT inhaler   Inhalation   Inhale 2 puffs into the lungs 2 (two) times daily.   1 Inhaler   12   . benzonatate (TESSALON) 200 MG capsule   Oral   Take 1 capsule (200 mg total) by mouth at bedtime.   12 capsule   0   . BIOTIN 5000 PO   Oral   Take by mouth 2 (two) times daily.         . Cyanocobalamin (VITAMIN B-12 PO)   Oral   Take by mouth.         Marland Kitchen  esomeprazole (NEXIUM) 40 MG capsule   Oral   Take 1 capsule (40 mg total) by mouth daily before breakfast.   30 capsule   3   . fluticasone (FLONASE) 50 MCG/ACT nasal spray   Nasal   Place 2 sprays into the nose daily.   16 g   1   . GLUCOSAMINE HCL PO   Oral   Take by mouth.         . montelukast (SINGULAIR) 10 MG tablet   Oral   Take 1 tablet (10 mg total) by mouth at bedtime.   30 tablet   3   . Multiple Vitamin (MULTIVITAMIN) tablet   Oral   Take 1 tablet by mouth daily.         . Omega-3 Fatty Acids (FISH OIL PO)   Oral   Take 1 tablet by mouth.         . predniSONE (DELTASONE) 20 MG tablet   Oral   Take 1 tablet (20 mg total) by mouth 2 (two) times daily. Take with food.   10 tablet   0   . rosuvastatin (CRESTOR) 20 MG tablet   Oral   Take 1 tablet (20 mg total) by mouth daily.   30 tablet   0     BP 132/85  Pulse 101  Temp(Src) 98.2 F (36.8 C) (Oral)  Ht 5\' 3"  (1.6 m)  Wt 187 lb (84.823 kg)  BMI 33.13 kg/m2  SpO2 99%  LMP 05/16/2012  Physical Exam Nursing notes and Vital Signs reviewed. Appearance:  Patient appears healthy and in no acute distress.  Patient is obese (BMI  33.1) Eyes:  Pupils are equal, round, and reactive to light and accomodation.  Extraocular movement is intact.  Conjunctivae are mildly injected. Ears:  Canals normal.  Tympanic membranes normal.  Nose:  Mildly congested turbinates.  No sinus tenderness.  Pharynx:  Erythematous posteriorly.  No exudate Neck:  Supple.  Tender shotty posterior nodes are palpated bilaterally  Lungs:  Clear to auscultation.  Breath sounds are equal. Chest:  Distinct tenderness to palpation over the mid-sternum.   Heart:  Regular rate and rhythm without murmurs, rubs, or gallops.  Abdomen:  Nontender without masses or hepatosplenomegaly.  Bowel sounds are present.  No CVA or flank tenderness.  Extremities:  No edema.  No calf tenderness Skin:  No rash present.   ED Course  Procedures  none  Labs Reviewed  STREP A DNA PROBE negative      1. Acute upper respiratory infections of unspecified site       MDM  There is no evidence of bacterial infection today.   Begin prednisone burst.  Prescription written for Benzonatate (Tessalon) to take at bedtime for night-time cough.  Take plain Mucinex (guaifenesin) twice daily for cough and congestion.  Increase fluid intake, rest. May use Afrin nasal spray (or generic oxymetazoline) twice daily for about 5 days.  Also recommend using saline nasal spray several times daily and saline nasal irrigation (AYR is a common brand).  Use Flonase spray each morning after Afrin spray and saline irrigation Stop all antihistamines for now, and other non-prescription cough/cold preparations. Continue QVAR, Albuterol inhaler, and Singulair. Begin Amoxicillin if not improving about 5 to 7 or if persistent fever develops (Given a prescription to hold, with an expiration date)  Follow-up with family doctor if not improving about10 days.        Lattie Haw, MD 06/10/12 [phone]

## 2012-06-09 NOTE — ED Notes (Signed)
Reports headache and facial/sinus pressure x 2 days; was seen by PCP 5 days ago and had ears cleaned out and states "ringing" sensation persists. Also noticed bilateral conjunctivitis. No OTCs since last evening.

## 2012-06-10 LAB — STREP A DNA PROBE: GASP: NEGATIVE

## 2012-06-11 ENCOUNTER — Telehealth: Payer: Self-pay | Admitting: *Deleted

## 2012-06-12 ENCOUNTER — Telehealth: Payer: Self-pay | Admitting: *Deleted

## 2012-06-12 NOTE — Telephone Encounter (Signed)
Pt labs mailed to home address

## 2012-06-17 ENCOUNTER — Telehealth: Payer: Self-pay | Admitting: *Deleted

## 2012-06-17 ENCOUNTER — Other Ambulatory Visit: Payer: Self-pay

## 2012-06-17 DIAGNOSIS — Z1231 Encounter for screening mammogram for malignant neoplasm of breast: Secondary | ICD-10-CM

## 2012-06-17 NOTE — Telephone Encounter (Signed)
Lab results mailed to pt home address

## 2012-06-19 ENCOUNTER — Encounter: Payer: 59 | Attending: Internal Medicine | Admitting: *Deleted

## 2012-06-19 ENCOUNTER — Encounter: Payer: Self-pay | Admitting: *Deleted

## 2012-06-19 VITALS — Ht 63.0 in | Wt 188.6 lb

## 2012-06-19 DIAGNOSIS — Z713 Dietary counseling and surveillance: Secondary | ICD-10-CM | POA: Insufficient documentation

## 2012-06-19 DIAGNOSIS — E669 Obesity, unspecified: Secondary | ICD-10-CM | POA: Insufficient documentation

## 2012-06-19 NOTE — Patient Instructions (Signed)
Goals:  1. 1 pound weight loss per week. 2. Incorporate healthy snacks up to 3 times daily to control hunger at meals and in the evening.  3. Balance nutrients (carbs, protein, fat) at meals.  4. Limit evening snack to one occasion.  5. Work up to exercising at least 30 minutes, 4 days weekly.

## 2012-06-19 NOTE — Progress Notes (Signed)
Medical Nutrition Therapy:  Appt start time: 1530 end time:  1630.  Assessment:  Primary concern today: Obesity. Patient reports that her primary goal is weight loss. She has a history of weight loss, but has regained each time. Her weight has been stable for about 2 years. She reports that she knows what to do, but gets off track. Emotional eating, particularly in the evenings is an issue. She has started walking, but allergies prohibit much activity outside. She has an aerobic walking DVD and wants to start exercising at home.   WEIGHT: 188.6 pounds BMI: 33.5  MEDICATIONS: Crestor, multivitamin, B12, fish oil, vitamin D, Vitamin C, aspirin, glucosamine   DIETARY INTAKE:   Usual eating pattern includes 3 meals and 1-2 snacks per day.  24-hr recall:  B (8-8:30 AM): Yogurt with walnuts/granola, lemonade/Crystal Light  Snk ( AM): None  L (1-2 PM): Leftovers or salad or cheeseburger, water/Crystal Light Snk ( PM): None, fruit D (7 PM): Enchiladas or grilled chicken breast/beef with carrots/salad and rice/mashed potatoes, water/Crystal Light/tea, sweet or unsweet Snk ( PM): Excessive snacking multiple times, popcorn, apples, orange, tortilla chips and salsa Beverages: Water, lemonade, Crystal Light  Usual physical activity: Walking, in house exercises (walking DVD)  Estimated energy needs: 1300 calories 163 g carbohydrates 81 g protein 36 g fat  Progress Towards Goal(s):  In progress.   Nutritional Diagnosis:  [place]-3.3 Overweight/obesity As related to history of excessive energy intake and physical inactivity.  As evidenced by BMI 33.5.    Intervention:  Nutrition counseling. We discussed strategies for weight loss, including balancing nutrients (carbs, protein, fat), portion control, healthy snacks, and exercise.   Goals:  1. 1 pound weight loss per week. 2. Incorporate healthy snacks up to 3 times daily to control hunger at meals and in the evening.  3. Balance nutrients (carbs,  protein, fat) at meals.  4. Limit evening snack to one occasion.  5. Work up to exercising at least 30 minutes, 4 days weekly.   Handouts given during visit include:  1200 calorie 5 day meal plan  Weight loss tips handout  Yellow portion card  Monitoring/Evaluation:  Dietary intake, exercise, and body weight in 1 month(s).

## 2012-07-03 ENCOUNTER — Other Ambulatory Visit: Payer: Self-pay | Admitting: Internal Medicine

## 2012-07-04 ENCOUNTER — Other Ambulatory Visit: Payer: Self-pay | Admitting: *Deleted

## 2012-07-04 MED ORDER — ROSUVASTATIN CALCIUM 20 MG PO TABS: 20.0000 mg | ORAL_TABLET | Freq: Every day | ORAL | Status: DC

## 2012-07-04 NOTE — Telephone Encounter (Signed)
Refill request

## 2012-07-09 ENCOUNTER — Ambulatory Visit
Admission: RE | Admit: 2012-07-09 | Discharge: 2012-07-09 | Disposition: A | Discharge status: Home / Self Care | Payer: 59 | Source: Ambulatory Visit

## 2012-07-09 DIAGNOSIS — Z1231 Encounter for screening mammogram for malignant neoplasm of breast: Secondary | ICD-10-CM

## 2012-07-24 ENCOUNTER — Ambulatory Visit: Payer: 59 | Admitting: *Deleted

## 2012-08-10 ENCOUNTER — Other Ambulatory Visit: Payer: Self-pay | Admitting: Internal Medicine

## 2012-08-12 NOTE — Telephone Encounter (Signed)
Refill request

## 2012-09-18 ENCOUNTER — Ambulatory Visit (INDEPENDENT_AMBULATORY_CARE_PROVIDER_SITE_OTHER): Payer: 59 | Admitting: Internal Medicine

## 2012-09-18 ENCOUNTER — Encounter: Payer: Self-pay | Admitting: Internal Medicine

## 2012-09-18 VITALS — BP 130/80 | HR 91 | Temp 97.5°F | Resp 18 | Ht 63.0 in | Wt 189.0 lb

## 2012-09-18 DIAGNOSIS — J45909 Unspecified asthma, uncomplicated: Secondary | ICD-10-CM

## 2012-09-18 DIAGNOSIS — J309 Allergic rhinitis, unspecified: Secondary | ICD-10-CM

## 2012-09-18 DIAGNOSIS — IMO0001 Reserved for inherently not codable concepts without codable children: Secondary | ICD-10-CM

## 2012-09-18 DIAGNOSIS — Z0000 Encounter for general adult medical examination without abnormal findings: Secondary | ICD-10-CM

## 2012-09-18 DIAGNOSIS — E785 Hyperlipidemia, unspecified: Secondary | ICD-10-CM

## 2012-09-18 DIAGNOSIS — R030 Elevated blood-pressure reading, without diagnosis of hypertension: Secondary | ICD-10-CM

## 2012-09-18 DIAGNOSIS — Z1151 Encounter for screening for human papillomavirus (HPV): Secondary | ICD-10-CM

## 2012-09-18 DIAGNOSIS — Z124 Encounter for screening for malignant neoplasm of cervix: Secondary | ICD-10-CM

## 2012-09-18 LAB — POCT URINALYSIS DIPSTICK
Bilirubin, UA: NEGATIVE
Ketones, UA: NEGATIVE
Leukocytes, UA: NEGATIVE
Spec Grav, UA: <=1.005

## 2012-09-18 NOTE — Progress Notes (Signed)
Subjective:    Patient ID: Elizabeth Dawson, female    DOB: [birth year], 52 y.o.   MRN: [id number]  HPI Lyric is here for CPE.    Doing well .  No wheezing or SOB  She has been  On Crestor and tolerating well no myalgias.    Allergies  Allergen Reactions  . Codeine   . Fenofibrate Other (See Comments)    Headache and leg pain   Past Medical History  Diagnosis Date  . Asthma   . Hyperlipidemia   . Premenopausal menorrhagia   . Sleep apnea    Past Surgical History  Procedure Laterality Date  . Nasal septum surgery  1997    For sleep apnea  . Uvulopalatopharyngoplasty  1997    For sleep apnea   History   Social History  . Marital Status: Divorced    Spouse Name: N/A    Number of Children: 0  . Years of Education: N/A   Occupational History  .  [employer]   Social History Main Topics  . Smoking status: Never Smoker   . Smokeless tobacco: Not on file  . Alcohol Use: Yes     Comment: Rarley.   . Drug Use: No  . Sexually Active: No   Other Topics Concern  . Not on file   Social History Narrative   Some exercise, walking.     Family History  Problem Relation Age of Onset  . Heart disease Mother   . Diabetes Mother   . Heart disease Father   . Mental illness Father     alzheimers   Patient Active Problem List   Diagnosis Date Noted  . Allergic rhinitis 06/06/2012  . 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 Cerumen impaction 06/04/2012  . OSA (obstructive sleep apnea) 05/30/2012  . Elevated blood pressure 05/30/2012  . UNSPECIFIED VITAMIN D DEFICIENCY 06/10/2009  . OBESITY, UNSPECIFIED 05/10/2009  . FATIGUE 04/07/2009  . HYPERLIPIDEMIA 02/22/2009  . ASTHMA 02/22/2009   Current Outpatient Prescriptions on File Prior to Visit  Medication Sig Dispense Refill  . aspirin 81 MG tablet Take 81 mg by mouth daily.      . beclomethasone (QVAR) 40 MCG/ACT inhaler Inhale 2 puffs into the lungs 2 (two) times daily.  1 Inhaler  12  . BIOTIN 5000 PO Take by  mouth 2 (two) times daily.      . CRESTOR 20 MG tablet TAKE ONE TABLET BY MOUTH ONE TIME DAILY  90 tablet  0  . Cyanocobalamin (VITAMIN B-12 PO) Take by mouth.      . esomeprazole (NEXIUM) 40 MG capsule Take 1 capsule (40 mg total) by mouth daily before breakfast.  30 capsule  3  . GLUCOSAMINE HCL PO Take by mouth.      . montelukast (SINGULAIR) 10 MG tablet Take 1 tablet (10 mg total) by mouth at bedtime.  30 tablet  3  . Multiple Vitamin (MULTIVITAMIN) tablet Take 1 tablet by mouth daily.      . Omega-3 Fatty Acids (FISH OIL PO) Take 1 tablet by mouth.      Marland Kitchen albuterol (VENTOLIN HFA) 108 (90 BASE) MCG/ACT inhaler Inhale 2 puffs into the lungs every 6 (six) hours as needed for wheezing or shortness of breath.  1 Inhaler  3  . fluticasone (FLONASE) 50 MCG/ACT nasal spray Place 2 sprays into the nose daily.  16 g  1   No current facility-administered medications on file prior to visit.  Review of Systems See HPI    Objective:   Physical Exam Physical Exam  Vital signs and nursing note reviewed  Constitutional: She is oriented to person, place, and time. She appears well-developed and well-nourished. She is cooperative.  HENT:  Head: Normocephalic and atraumatic.  Right Ear: Tympanic membrane normal.  Left Ear: Tympanic membrane normal.  Nose: Nose normal.  Mouth/Throat: Oropharynx is clear and moist and mucous membranes are normal. No oropharyngeal exudate or posterior oropharyngeal erythema.  Eyes: Conjunctivae and EOM are normal. Pupils are equal, round, and reactive to light.  Neck: Neck supple. No JVD present. Carotid bruit is not present. No mass and no thyromegaly present.  Cardiovascular: Regular rhythm, normal heart sounds, intact distal pulses and normal pulses.  Exam reveals no gallop and no friction rub.   No murmur heard. Pulses:      Dorsalis pedis pulses are 2+ on the right side, and 2+ on the left side.  Pulmonary/Chest: Breath sounds normal. She has no  wheezes. She has no rhonchi. She has no rales. Right breast exhibits no mass, no nipple discharge and no skin change. Left breast exhibits no mass, no nipple discharge and no skin change.  Abdominal: Soft. Bowel sounds are normal. She exhibits no distension and no mass. There is no hepatosplenomegaly. There is no tenderness. There is no CVA tenderness.  Genitourinary: Rectum normal, vagina normal and uterus normal. Rectal exam shows no mass. Guaiac negative stool. No labial fusion. There is no lesion on the right labia. There is no lesion on the left labia. Cervix exhibits no motion tenderness. Right adnexum displays no mass, no tenderness and no fullness. Left adnexum displays no mass, no tenderness and no fullness. No erythema around the vagina.  Musculoskeletal:       No active synovitis to any joint.    Lymphadenopathy:       Right cervical: No superficial cervical adenopathy present.      Left cervical: No superficial cervical adenopathy present.       Right axillary: No pectoral and no lateral adenopathy present.       Left axillary: No pectoral and no lateral adenopathy present.      Right: No inguinal adenopathy present.       Left: No inguinal adenopathy present.  Neurological: She is alert and oriented to person, place, and time. She has normal strength and normal reflexes. No cranial nerve deficit or sensory deficit. She displays a negative Romberg sign. Coordination and gait normal.  Skin: Skin is warm and dry. No abrasion, no bruising, no ecchymosis and no rash noted. No cyanosis. Nails show no clubbing.  Psychiatric: She has a normal mood and affect. Her speech is normal and behavior is normal.          Assessment & Plan:  Health Maintenance:   See scanned sheet  UTD with mm   colonscopy , Pap today.    Hyperlipidemia  Will check today  She is on crestor 20 mg  hsitory of asthma   Minimal symptomatology this summer  Allergic rhinitis          Assessment & Plan:

## 2012-10-01 ENCOUNTER — Telehealth: Payer: Self-pay | Admitting: Internal Medicine

## 2012-10-01 DIAGNOSIS — R87618 Other abnormal cytological findings on specimens from cervix uteri: Secondary | ICD-10-CM

## 2012-10-01 NOTE — Telephone Encounter (Signed)
Spoke with pt and informed of endometrial cells on pap result  Will refer to GYN for further eval

## 2012-10-04 LAB — LIPID PANEL
Cholesterol: 178 mg/dL (ref 0–200)
HDL: 44 mg/dL (ref >39)
Total CHOL/HDL Ratio: 4 Ratio

## 2012-10-05 ENCOUNTER — Other Ambulatory Visit: Payer: Self-pay | Admitting: Internal Medicine

## 2012-10-07 ENCOUNTER — Encounter: Payer: Self-pay | Admitting: *Deleted

## 2012-10-07 NOTE — Telephone Encounter (Signed)
Refill request

## 2012-10-09 ENCOUNTER — Encounter: Payer: Self-pay | Admitting: Gynecology

## 2012-10-09 ENCOUNTER — Ambulatory Visit (INDEPENDENT_AMBULATORY_CARE_PROVIDER_SITE_OTHER): Payer: 59 | Admitting: Gynecology

## 2012-10-09 DIAGNOSIS — IMO0002 Reserved for concepts with insufficient information to code with codable children: Secondary | ICD-10-CM

## 2012-10-09 DIAGNOSIS — R6889 Other general symptoms and signs: Secondary | ICD-10-CM

## 2012-10-09 NOTE — Progress Notes (Signed)
52 y.o. divorced African American female   G1P0010 referred for endometrial cells noted on most recent PAP smear. . Pt reports menses are not regular.  She brought in a menstrual chart, cycles are 4d of flow and has occasional missed cycles, missed cycles have been for the last year.  Pt is not sexually active. She does not report hot flashes, does not have night sweats, does not have vaginal dryness.  Menses July 5-8, PAP was 7/9 and pt reports being at the end of her cycle. Pt states that she uses tampons changes twice a day, uses pad at night might change once a night, small nickel size clots, reports some premenstrual cramping no breast tenderness or middleschirtz.  Patient's last menstrual period was 09/14/2012.          Sexually active: no  The current method of family planning is abstinence.    Exercising: yes  walking Last pap: 09/2011 negative, no HRHPV, +endometrial cells Abnormal PAP: none Mammogram: 06/2012 BSE: yes Colonoscopy: 2012 Alcohol: no Tobacco: no  Health Maintenance  Topic Date Due  . Influenza Vaccine  11/11/2012  . Mammogram  01/08/2013  . Pap Smear  09/19/2015  . Tetanus/tdap  03/13/2016  . Colonoscopy  03/13/2020    Family History  Problem Relation Age of Onset  . Heart disease Mother   . Diabetes Mother   . Heart disease Father   . Mental illness Father     alzheimers    Patient Active Problem List   Diagnosis Date Noted  . Allergic rhinitis 06/06/2012  . 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 Cerumen impaction 06/04/2012  . OSA (obstructive sleep apnea) 05/30/2012  . Elevated blood pressure 05/30/2012  . UNSPECIFIED VITAMIN D DEFICIENCY 06/10/2009  . OBESITY, UNSPECIFIED 05/10/2009  . FATIGUE 04/07/2009  . HYPERLIPIDEMIA 02/22/2009  . ASTHMA 02/22/2009    Past Medical History  Diagnosis Date  . Asthma   . Hyperlipidemia   . Premenopausal menorrhagia   . Sleep apnea     Past Surgical History  Procedure Laterality Date  . Nasal septum  surgery  1997    For sleep apnea  . Uvulopalatopharyngoplasty  1997    For sleep apnea    Allergies: Codeine and Fenofibrate  Current Outpatient Prescriptions  Medication Sig Dispense Refill  . albuterol (VENTOLIN HFA) 108 (90 BASE) MCG/ACT inhaler Inhale 2 puffs into the lungs every 6 (six) hours as needed for wheezing or shortness of breath.  1 Inhaler  3  . aspirin 81 MG tablet Take 81 mg by mouth daily.      . beclomethasone (QVAR) 40 MCG/ACT inhaler Inhale 2 puffs into the lungs 2 (two) times daily.  1 Inhaler  12  . BIOTIN 5000 PO Take by mouth 2 (two) times daily.      . CRESTOR 20 MG tablet TAKE ONE TABLET BY MOUTH ONE TIME DAILY  90 tablet  0  . Cyanocobalamin (VITAMIN B-12 PO) Take by mouth.      . fluticasone (FLONASE) 50 MCG/ACT nasal spray Place 2 sprays into the nose daily.  16 g  1  . GLUCOSAMINE HCL PO Take by mouth.      . montelukast (SINGULAIR) 10 MG tablet Take one tablet by mouth   nightly at bedtime  30 tablet  3  . Multiple Vitamin (MULTIVITAMIN) tablet Take 1 tablet by mouth daily.      Marland Kitchen NEXIUM 40 MG capsule Take one capsule by mouth every day  before breakfast  30 capsule  3  . Omega-3 Fatty Acids (FISH OIL PO) Take 1 tablet by mouth.      . pyridOXINE (VITAMIN B-6) 100 MG tablet Take 100 mg by mouth daily.       No current facility-administered medications for this visit.    ROS: Pertinent items are noted in HPI.  Exam:    LMP 09/14/2012 Weight change: @WEIGHTCHANGE @ Last 3 height recordings:  Ht Readings from Last 3 Encounters:  09/18/12 5\' 3"  (1.6 m)  06/19/12 5\' 3"  (1.6 m)  06/09/12 5\' 3"  (1.6 m)   General appearance: alert, cooperative and appears stated age Head: Normocephalic, without obvious abnormality, atraumatic Lungs: clear to auscultation bilaterally Heart: regular rate and rhythm, S1, S2 normal, no murmur, click, rub or gallop Abdomen: soft, non-tender; no masses Extremities: extremities normal, atraumatic, no cyanosis or edema Skin:  Skin color, texture, turgor normal. No rashes or lesions no inguinal nodes palpated Neurologic: Grossly normal   Pelvic: External genitalia:  no lesions              Urethra: normal appearing urethra with no masses, tenderness or lesions              Bartholins and Skenes: normal                 Vagina: normal appearing vagina with normal color and discharge, no lesions              Cervix: normal appearance and no blood              Pap taken: no        Bimanual Exam:  Uterus:  uterus is normal size, shape, consistency and nontender                                      Adnexa:    normal adnexa in size, nontender and no masses                                      Rectovaginal: Deferred                                      Anus:  defer exam  A: referral for endometrial cells noted on PAP    P: Reviewed menstrual calendar brought in by pt, pt is peri-menopausal and was on the last day of her flow when the pap was done, no abnormal PAP's before, since she does not report symptoms suggestive of post-menopausal state or abnormal bleeding such as menorrhagia or metrorrhagia, further evaluation is not warrented, ie emb We reviewed signs and symptoms of menopause, questions addressed Discussed PAP guideline changes, importance of weight bearing exercises, calcium, vit D and balanced diet.  An After Visit Summary was printed and given to the patient.

## 2012-11-11 ENCOUNTER — Other Ambulatory Visit: Payer: Self-pay | Admitting: Internal Medicine

## 2012-11-12 ENCOUNTER — Other Ambulatory Visit: Payer: Self-pay | Admitting: *Deleted

## 2012-11-12 MED ORDER — ROSUVASTATIN CALCIUM 20 MG PO TABS: 20.0000 mg | ORAL_TABLET | Freq: Every day | ORAL | Status: DC

## 2012-11-12 NOTE — Telephone Encounter (Signed)
error 

## 2012-11-12 NOTE — Telephone Encounter (Signed)
Refill request

## 2013-01-14 ENCOUNTER — Encounter: Payer: Self-pay | Admitting: Emergency Medicine

## 2013-01-14 ENCOUNTER — Emergency Department (INDEPENDENT_AMBULATORY_CARE_PROVIDER_SITE_OTHER)
Admission: EM | Admit: 2013-01-14 | Discharge: 2013-01-14 | Disposition: A | Discharge status: Home / Self Care | Payer: 59 | Source: Home / Self Care | Attending: Emergency Medicine | Admitting: Emergency Medicine

## 2013-01-14 DIAGNOSIS — H1010 Acute atopic conjunctivitis, unspecified eye: Secondary | ICD-10-CM

## 2013-01-14 DIAGNOSIS — H10029 Other mucopurulent conjunctivitis, unspecified eye: Secondary | ICD-10-CM

## 2013-01-14 DIAGNOSIS — H1033 Unspecified acute conjunctivitis, bilateral: Secondary | ICD-10-CM

## 2013-01-14 DIAGNOSIS — H1013 Acute atopic conjunctivitis, bilateral: Secondary | ICD-10-CM

## 2013-01-14 DIAGNOSIS — J309 Allergic rhinitis, unspecified: Secondary | ICD-10-CM

## 2013-01-14 MED ORDER — FLUTICASONE PROPIONATE 50 MCG/ACT NA SUSP: NASAL | Status: DC

## 2013-01-14 MED ORDER — POLYMYXIN B-TRIMETHOPRIM 10000-0.1 UNIT/ML-% OP SOLN: OPHTHALMIC | Status: DC

## 2013-01-14 MED ORDER — NAPHAZOLINE-PHENIRAMINE 0.025-0.3 % OP SOLN: 2.0000 [drp] | Freq: Four times a day (QID) | OPHTHALMIC | Status: DC | PRN

## 2013-01-14 NOTE — ED Notes (Signed)
Elizabeth Dawson c/o 1 week of bumps to bilateral eyelids and eye itching with clear drainage/watery.

## 2013-01-14 NOTE — ED Provider Notes (Signed)
CSN: [id number]     Arrival date & time 01/14/13  9562 History   First MD Initiated Contact with Patient 01/14/13 [phone]     Chief Complaint  Patient presents with  . Itchy Eye   (Consider location/radiation/quality/duration/timing/severity/associated sxs/prior Treatment) HPI URI HISTORY  Cheynne is a 52 y.o. female who complains of bilateral itchy, watery eyes for one week, then 2 days of yellow matted discharge both eyes in the morning.   She has a history of asthma, controlled on Singulair and Qvar prescribed by her PCP. Also complains of sinus congestion bilateral ear pain and pressure, with some clear rhinorrhea.  No chills/sweats No Fever  +  Nasal congestion No  Discolored Post-nasal drainage No sinus pain/pressure No sore throat  No  cough No wheezing No chest congestion No hemoptysis No shortness of breath No pleuritic pain  Positive itchy/red eyes Bilateral mild earache, pressure  No nausea No vomiting No abdominal pain No diarrhea  No skin rashes No Fatigue No myalgias No headache  Past Medical History  Diagnosis Date  . Asthma   . Hyperlipidemia   . Premenopausal menorrhagia   . Sleep apnea    Past Surgical History  Procedure Laterality Date  . Nasal septum surgery  1997    For sleep apnea  . Uvulopalatopharyngoplasty  1997    For sleep apnea   Family History  Problem Relation Age of Onset  . Heart disease Mother   . Diabetes Mother   . Heart disease Father   . Mental illness Father     alzheimers   History  Substance Use Topics  . Smoking status: Never Smoker   . Smokeless tobacco: Never Used  . Alcohol Use: No     Comment: Rarley.    OB History   Grav Para Term Preterm Abortions TAB SAB Ect Mult Living   1    1 1     0     Review of Systems  All other systems reviewed and are negative.   See above. Otherwise negative Allergies  Codeine and Fenofibrate  Home Medications   Current Outpatient Rx  Name  Route  Sig   Dispense  Refill  . albuterol (VENTOLIN HFA) 108 (90 BASE) MCG/ACT inhaler   Inhalation   Inhale 2 puffs into the lungs every 6 (six) hours as needed for wheezing or shortness of breath.   1 Inhaler   3   . aspirin 81 MG tablet   Oral   Take 81 mg by mouth daily.         . beclomethasone (QVAR) 40 MCG/ACT inhaler   Inhalation   Inhale 2 puffs into the lungs 2 (two) times daily.   1 Inhaler   12   . BIOTIN 5000 PO   Oral   Take by mouth 2 (two) times daily.         . Cyanocobalamin (VITAMIN B-12 PO)   Oral   Take by mouth.         . fluticasone (FLONASE) 50 MCG/ACT nasal spray   Nasal   Place 2 sprays into the nose daily.   16 g   1   . fluticasone (FLONASE) 50 MCG/ACT nasal spray      1 or 2 sprays each nostril twice a day   16 g   0   . GLUCOSAMINE HCL PO   Oral   Take by mouth.         . montelukast (SINGULAIR) 10 MG tablet  Take one tablet by mouth   nightly at bedtime   30 tablet   3   . Multiple Vitamin (MULTIVITAMIN) tablet   Oral   Take 1 tablet by mouth daily.         . naphazoline-pheniramine (NAPHCON-A) 0.025-0.3 % ophthalmic solution   Both Eyes   Place 2 drops into both eyes 4 (four) times daily as needed for irritation. /Itch   15 mL   0   . NEXIUM 40 MG capsule      Take one capsule by mouth every day  before breakfast   30 capsule   3   . Omega-3 Fatty Acids (FISH OIL PO)   Oral   Take 1 tablet by mouth.         . pyridOXINE (VITAMIN B-6) 100 MG tablet   Oral   Take 100 mg by mouth daily.         . rosuvastatin (CRESTOR) 20 MG tablet   Oral   Take 1 tablet (20 mg total) by mouth daily.   90 tablet   0   . trimethoprim-polymyxin b (POLYTRIM) ophthalmic solution      2 drop in affected eye(s) every 4 hours (while awake) x 7 days   10 mL   0    BP 137/82  Pulse 90  Temp(Src) 98.1 F (36.7 C) (Oral)  Resp 14  Wt 188 lb (85.276 kg)  SpO2 100% Physical Exam  Nursing note and vitals  reviewed. Constitutional: She is oriented to person, place, and time. She appears well-developed and well-nourished. No distress.  HENT:  Head: Normocephalic and atraumatic.  Right Ear: External ear normal.  Left Ear: External ear normal.  Nose: Mucosal edema and rhinorrhea (clearish) present. Right sinus exhibits no maxillary sinus tenderness and no frontal sinus tenderness. Left sinus exhibits no maxillary sinus tenderness and no frontal sinus tenderness.  Mouth/Throat: Oropharynx is clear and moist and mucous membranes are normal. No oral lesions. No posterior oropharyngeal edema or posterior oropharyngeal erythema.  Mild bilateral wax in the external canals bilaterally. Otherwise, ears within normal limits. TMs normal.  Eyes: EOM are normal. Pupils are equal, round, and reactive to light. Right eye exhibits discharge. No foreign body present in the right eye. Left eye exhibits discharge. No foreign body present in the left eye. Right conjunctiva is injected. Right conjunctiva has no hemorrhage. Left conjunctiva is injected. Left conjunctiva has no hemorrhage. No scleral icterus.  Mild bilateral matted yellow discharge, right conjunctiva worse than left  Neck: Normal range of motion.  Cardiovascular: Normal rate.   Pulmonary/Chest: Effort normal and breath sounds normal. She has no wheezes.  Abdominal: She exhibits no distension.  Musculoskeletal: Normal range of motion.  Neurological: She is alert and oriented to person, place, and time.  Skin: Skin is warm.  Psychiatric: She has a normal mood and affect.    ED Course  Procedures (including critical care time) Labs Review Labs Reviewed - No data to display Imaging Review No results found.  EKG Interpretation     Ventricular Rate:    PR Interval:    QRS Duration:   QT Interval:    QTC Calculation:   R Axis:     Text Interpretation:              MDM   1. Acute bacterial conjunctivitis of both eyes   2. Allergic  conjunctivitis, acute, bilateral   3. Allergic rhinitis    Discussed treatment options. Hygiene and contagiousness  discussed. Polytrim eyedrops.-For the bacterial conjunctivitis. She likely also has an element of allergic conjunctivitis, so Naphcon-A eyedrops prescribed. May use OTC Claritin or Zyrtec when necessary seasonal allergies . Flonase rx. Continue The Qvar and Singulair as prescribed by PCP. Followup with PCP if no better one week, sooner if worse or new symptoms  Precautions discussed. Red flags discussed. Questions invited and answered. Patient voiced understanding and agreement.    Lajean Manes, MD 01/14/13 [phone]

## 2013-01-16 ENCOUNTER — Other Ambulatory Visit: Payer: Self-pay

## 2013-02-09 ENCOUNTER — Other Ambulatory Visit: Payer: Self-pay | Admitting: Internal Medicine

## 2013-02-10 NOTE — Telephone Encounter (Signed)
Refill request last labs 09/2012

## 2013-05-07 ENCOUNTER — Encounter: Payer: Self-pay | Admitting: Internal Medicine

## 2013-05-07 ENCOUNTER — Ambulatory Visit (INDEPENDENT_AMBULATORY_CARE_PROVIDER_SITE_OTHER): Payer: 59 | Admitting: Internal Medicine

## 2013-05-07 VITALS — BP 142/87 | HR 96 | Temp 98.2°F | Resp 18 | Wt 192.0 lb

## 2013-05-07 DIAGNOSIS — R05 Cough: Secondary | ICD-10-CM

## 2013-05-07 DIAGNOSIS — J209 Acute bronchitis, unspecified: Secondary | ICD-10-CM

## 2013-05-07 DIAGNOSIS — J45901 Unspecified asthma with (acute) exacerbation: Secondary | ICD-10-CM

## 2013-05-07 DIAGNOSIS — R059 Cough, unspecified: Secondary | ICD-10-CM

## 2013-05-07 DIAGNOSIS — R6889 Other general symptoms and signs: Secondary | ICD-10-CM

## 2013-05-07 LAB — POCT INFLUENZA A/B: Influenza A, POC: NEGATIVE

## 2013-05-07 MED ORDER — ALBUTEROL SULFATE (2.5 MG/3ML) 0.083% IN NEBU: 2.5000 mg | INHALATION_SOLUTION | Freq: Once | RESPIRATORY_TRACT | Status: DC

## 2013-05-07 MED ORDER — ALBUTEROL SULFATE HFA 108 (90 BASE) MCG/ACT IN AERS: 2.0000 | INHALATION_SPRAY | Freq: Four times a day (QID) | RESPIRATORY_TRACT | Status: DC | PRN

## 2013-05-07 MED ORDER — METHYLPREDNISOLONE ACETATE 80 MG/ML IJ SUSP: 160.0000 mg | Freq: Once | INTRAMUSCULAR | Status: DC

## 2013-05-07 MED ORDER — AZITHROMYCIN 250 MG PO TABS
ORAL_TABLET | ORAL | Status: DC
Start: 2013-05-07 — End: 2013-05-12

## 2013-05-07 MED ORDER — ALBUTEROL SULFATE HFA 108 (90 BASE) MCG/ACT IN AERS: 2.0000 | INHALATION_SPRAY | Freq: Four times a day (QID) | RESPIRATORY_TRACT | Status: AC | PRN

## 2013-05-07 MED ORDER — PREDNISONE 20 MG PO TABS: ORAL_TABLET | ORAL | Status: DC

## 2013-05-07 NOTE — Patient Instructions (Signed)
See me Monday

## 2013-05-07 NOTE — Progress Notes (Signed)
Subjective:    Patient ID: Elizabeth Dawson, female    DOB: 02-Aug-1960, 53 y.o.   MRN: [id number]  HPI  Elizabeth Dawson is here for acute visit.  Several days of green nasal discharge, cough productive yellow mucous.  Wheezing at night and in am.  Used albuterol twice yesterday but it is expired.    No chest pain  No documented fever.  Dyspnea with exertion only  Allergies  Allergen Reactions  . Codeine   . Fenofibrate Other (See Comments)    Headache and leg pain   Past Medical History  Diagnosis Date  . Asthma   . Hyperlipidemia   . Premenopausal menorrhagia   . Sleep apnea    Past Surgical History  Procedure Laterality Date  . Nasal septum surgery  1997    For sleep apnea  . Uvulopalatopharyngoplasty  1997    For sleep apnea   History   Social History  . Marital Status: Divorced    Spouse Name: N/A    Number of Children: 0  . Years of Education: N/A   Occupational History  .  Unemployed   Social History Main Topics  . Smoking status: Never Smoker   . Smokeless tobacco: Never Used  . Alcohol Use: No     Comment: Rarley.   . Drug Use: No  . Sexual Activity: No   Other Topics Concern  . Not on file   Social History Narrative   Some exercise, walking.     Family History  Problem Relation Age of Onset  . Heart disease Mother   . Diabetes Mother   . Heart disease Father   . Mental illness Father     alzheimers   Patient Active Problem List   Diagnosis Date Noted  . Allergic rhinitis 06/06/2012  . 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 Cerumen impaction 06/04/2012  . OSA (obstructive sleep apnea) 05/30/2012  . Elevated blood pressure 05/30/2012  . UNSPECIFIED VITAMIN D DEFICIENCY 06/10/2009  . OBESITY, UNSPECIFIED 05/10/2009  . FATIGUE 04/07/2009  . HYPERLIPIDEMIA 02/22/2009  . ASTHMA 02/22/2009   Current Outpatient Prescriptions on File Prior to Visit  Medication Sig Dispense Refill  . albuterol (VENTOLIN HFA) 108 (90 BASE) MCG/ACT inhaler Inhale 2  puffs into the lungs every 6 (six) hours as needed for wheezing or shortness of breath.  1 Inhaler  3  . aspirin 81 MG tablet Take 81 mg by mouth daily.      . beclomethasone (QVAR) 40 MCG/ACT inhaler Inhale 2 puffs into the lungs 2 (two) times daily.  1 Inhaler  12  . BIOTIN 5000 PO Take by mouth 2 (two) times daily.      . CRESTOR 20 MG tablet TAKE ONE TABLET BY MOUTH ONE TIME DAILY   90 tablet  0  . Cyanocobalamin (VITAMIN B-12 PO) Take by mouth.      . fluticasone (FLONASE) 50 MCG/ACT nasal spray Place 2 sprays into the nose daily.  16 g  1  . GLUCOSAMINE HCL PO Take by mouth.      . montelukast (SINGULAIR) 10 MG tablet TAKE ONE TABLET BY MOUTH NIGHTLY AT BEDTIME   90 tablet  1  . Multiple Vitamin (MULTIVITAMIN) tablet Take 1 tablet by mouth daily.      . naphazoline-pheniramine (NAPHCON-A) 0.025-0.3 % ophthalmic solution Place 2 drops into both eyes 4 (four) times daily as needed for irritation. /Itch  15 mL  0  . Omega-3 Fatty Acids (FISH OIL PO) Take 1  tablet by mouth.      . pyridOXINE (VITAMIN B-6) 100 MG tablet Take 100 mg by mouth daily.      Marland Kitchen. NEXIUM 40 MG capsule TAKE ONE CAPSULE BY MOUTH EVERY DAY BEFORE BREAKFAST   90 capsule  1   No current facility-administered medications on file prior to visit.     Review of Systems    see HPI Objective:   Physical Exam Physical Exam  Nursing note and vitals reviewed.   Peak flow 260 Constitutional: She is oriented to person, place, and time. She appears well-developed and well-nourished.  HENT:  Head: Normocephalic and atraumatic.  Cardiovascular: Normal rate and regular rhythm. Exam reveals no gallop and no friction rub.  No murmur heard.  Pulmonary/Chest: Breath sounds normal. She has end expiratory wheeze.  Marland Kitchen. She has no rales.  Neurological: She is alert and oriented to person, place, and time.  Skin: Skin is warm and dry.  Psychiatric: She has a normal mood and affect. Her behavior is normal.        Assessment & Plan:    Asthma exacerbattion  :  Will give albuterol neb,  Depo-medrol  160 mg in office and prednisone taper 60 mg over 9 days..  Rx albuterol to use 2 inhaltions tid until I see her next week.    Bronchitis  Will give Z-pack    Cough Prednisone will help  See me Monday

## 2013-05-12 ENCOUNTER — Ambulatory Visit (INDEPENDENT_AMBULATORY_CARE_PROVIDER_SITE_OTHER): Payer: 59 | Admitting: Internal Medicine

## 2013-05-12 ENCOUNTER — Encounter: Payer: Self-pay | Admitting: Internal Medicine

## 2013-05-12 VITALS — BP 136/84 | HR 96 | Temp 98.5°F | Resp 18 | Wt 194.0 lb

## 2013-05-12 DIAGNOSIS — R05 Cough: Secondary | ICD-10-CM

## 2013-05-12 DIAGNOSIS — R059 Cough, unspecified: Secondary | ICD-10-CM

## 2013-05-12 DIAGNOSIS — J45901 Unspecified asthma with (acute) exacerbation: Secondary | ICD-10-CM

## 2013-05-12 DIAGNOSIS — J209 Acute bronchitis, unspecified: Secondary | ICD-10-CM

## 2013-05-12 NOTE — Progress Notes (Signed)
Subjective:    Patient ID: Elizabeth Dawson, female    DOB: [birth year], 53 y.o.   MRN: [id number]  HPI  Aariona is here for follow up of acute bronchitis with asthma exacerbation.  Now on 40 mg prednisone - only used albuterol twice yesterday.  Cough productive clear mucous  Allergies  Allergen Reactions  . Codeine   . Fenofibrate Other (See Comments)    Headache and leg pain   Past Medical History  Diagnosis Date  . Asthma   . Hyperlipidemia   . Premenopausal menorrhagia   . Sleep apnea    Past Surgical History  Procedure Laterality Date  . Nasal septum surgery  1997    For sleep apnea  . Uvulopalatopharyngoplasty  1997    For sleep apnea   History   Social History  . Marital Status: Divorced    Spouse Name: N/A    Number of Children: 0  . Years of Education: N/A   Occupational History  .  Unemployed   Social History Main Topics  . Smoking status: Never Smoker   . Smokeless tobacco: Never Used  . Alcohol Use: No     Comment: Rarley.   . Drug Use: No  . Sexual Activity: No   Other Topics Concern  . Not on file   Social History Narrative   Some exercise, walking.     Family History  Problem Relation Age of Onset  . Heart disease Mother   . Diabetes Mother   . Heart disease Father   . Mental illness Father     alzheimers   Patient Active Problem List   Diagnosis Date Noted  . Allergic rhinitis 06/06/2012  . 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 Cerumen impaction 06/04/2012  . OSA (obstructive sleep apnea) 05/30/2012  . Elevated blood pressure 05/30/2012  . UNSPECIFIED VITAMIN D DEFICIENCY 06/10/2009  . OBESITY, UNSPECIFIED 05/10/2009  . FATIGUE 04/07/2009  . HYPERLIPIDEMIA 02/22/2009  . ASTHMA 02/22/2009   Current Outpatient Prescriptions on File Prior to Visit  Medication Sig Dispense Refill  . albuterol (VENTOLIN HFA) 108 (90 BASE) MCG/ACT inhaler Inhale 2 puffs into the lungs every 6 (six) hours as needed for wheezing or shortness of breath.   1 Inhaler  3  . aspirin 81 MG tablet Take 81 mg by mouth daily.      . beclomethasone (QVAR) 40 MCG/ACT inhaler Inhale 2 puffs into the lungs 2 (two) times daily.  1 Inhaler  12  . BIOTIN 5000 PO Take by mouth 2 (two) times daily.      . CRESTOR 20 MG tablet TAKE ONE TABLET BY MOUTH ONE TIME DAILY   90 tablet  0  . GLUCOSAMINE HCL PO Take by mouth.      . montelukast (SINGULAIR) 10 MG tablet TAKE ONE TABLET BY MOUTH NIGHTLY AT BEDTIME   90 tablet  1  . Multiple Vitamin (MULTIVITAMIN) tablet Take 1 tablet by mouth daily.      . naphazoline-pheniramine (NAPHCON-A) 0.025-0.3 % ophthalmic solution Place 2 drops into both eyes 4 (four) times daily as needed for irritation. /Itch  15 mL  0  . NEXIUM 40 MG capsule TAKE ONE CAPSULE BY MOUTH EVERY DAY BEFORE BREAKFAST   90 capsule  1  . Omega-3 Fatty Acids (FISH OIL PO) Take 1 tablet by mouth.      . predniSONE (DELTASONE) 20 MG tablet Take 3 tablets daily for 3 days then 2 tablets for 3 days then 1 tablet for  3 days then stop  18 tablet  0  . pyridOXINE (VITAMIN B-6) 100 MG tablet Take 100 mg by mouth daily.      . Cyanocobalamin (VITAMIN B-12 PO) Take by mouth.      . fluticasone (FLONASE) 50 MCG/ACT nasal spray Place 2 sprays into the nose daily.  16 g  1   No current facility-administered medications on file prior to visit.      Review of Systems See HPI    Objective:   Physical Exam  Physical Exam  Nursing note and vitals reviewed.  Peak 310 Constitutional: She is oriented to person, place, and time. She appears well-developed and well-nourished.  HENT:  Head: Normocephalic and atraumatic.  Cardiovascular: Normal rate and regular rhythm. Exam reveals no gallop and no friction rub.  No murmur heard.  Pulmonary/Chest: Breath sounds normal. She has no wheezes. She has no rales.  Moving air well Neurological: She is alert and oriented to person, place, and time.  Skin: Skin is warm and dry.  Psychiatric: She has a normal mood and  affect. Her behavior is normal.            Assessment & Plan:  Asthma OK to taper off albuterol  Use only for rescue.  Continue Prednisone and Qvar with singulair  Bronghitis  Finish  Antibiotic  Cough continue meds

## 2013-05-12 NOTE — Patient Instructions (Signed)
See me as needed 

## 2013-05-21 ENCOUNTER — Other Ambulatory Visit: Payer: Self-pay | Admitting: Internal Medicine

## 2013-05-22 NOTE — Telephone Encounter (Signed)
Refill request

## 2013-05-25 NOTE — Telephone Encounter (Signed)
Elizabeth Dawson  Call pt and let her know that I ordered #30 tablets for her.  She has not had labs since 05/2012 1 year ago.  Have her get fasting CMP  And lipid and then I can order more for her

## 2013-05-26 NOTE — Telephone Encounter (Signed)
Notified pt Via VM message. That pt will need

## 2013-05-28 ENCOUNTER — Other Ambulatory Visit: Payer: Self-pay | Admitting: *Deleted

## 2013-05-28 DIAGNOSIS — Z139 Encounter for screening, unspecified: Secondary | ICD-10-CM

## 2013-05-28 LAB — COMPREHENSIVE METABOLIC PANEL
ALT: 24 U/L (ref 0–35)
AST: 23 U/L (ref 0–37)
Albumin: 4.2 g/dL (ref 3.5–5.2)
Alkaline Phosphatase: 56 U/L (ref 39–117)
BILIRUBIN TOTAL: 0.5 mg/dL (ref 0.2–1.2)
BUN: 16 mg/dL (ref 6–23)
CO2: 27 meq/L (ref 19–32)
CREATININE: 0.74 mg/dL (ref 0.50–1.10)
Calcium: 9 mg/dL (ref 8.4–10.5)
Chloride: 104 mEq/L (ref 96–112)
GLUCOSE: 101 mg/dL — AB (ref 70–99)
Potassium: 4 mEq/L (ref 3.5–5.3)
Sodium: 140 mEq/L (ref 135–145)
Total Protein: 6.5 g/dL (ref 6.0–8.3)

## 2013-05-28 LAB — LIPID PANEL
CHOL/HDL RATIO: 3.5 ratio
CHOLESTEROL: 211 mg/dL — AB (ref 0–200)
HDL: 61 mg/dL (ref >39)
LDL Cholesterol: 125 mg/dL — ABNORMAL HIGH (ref 0–99)
TRIGLYCERIDES: 126 mg/dL (ref <150)
VLDL: 25 mg/dL (ref 0–40)

## 2013-05-28 LAB — TSH: TSH: 3.588 u[IU]/mL (ref 0.350–4.500)

## 2013-05-29 LAB — VITAMIN D 25 HYDROXY (VIT D DEFICIENCY, FRACTURES): Vit D, 25-Hydroxy: 71 ng/mL (ref 30–89)

## 2013-05-31 ENCOUNTER — Encounter: Payer: Self-pay | Admitting: Internal Medicine

## 2013-07-16 ENCOUNTER — Other Ambulatory Visit: Payer: Self-pay | Admitting: Internal Medicine

## 2013-07-17 ENCOUNTER — Other Ambulatory Visit: Payer: Self-pay | Admitting: *Deleted

## 2013-07-17 NOTE — Telephone Encounter (Signed)
Refill request

## 2013-07-18 MED ORDER — ROSUVASTATIN CALCIUM 20 MG PO TABS: 20.0000 mg | ORAL_TABLET | Freq: Every day | ORAL | Status: DC

## 2013-08-05 ENCOUNTER — Other Ambulatory Visit: Payer: Self-pay | Admitting: Internal Medicine

## 2013-08-05 ENCOUNTER — Other Ambulatory Visit: Payer: Self-pay | Admitting: *Deleted

## 2013-08-05 DIAGNOSIS — Z1231 Encounter for screening mammogram for malignant neoplasm of breast: Secondary | ICD-10-CM

## 2013-08-05 NOTE — Telephone Encounter (Signed)
Refill request

## 2013-08-06 MED ORDER — ESOMEPRAZOLE MAGNESIUM 40 MG PO CPDR: DELAYED_RELEASE_CAPSULE | ORAL | Status: AC

## 2013-08-22 ENCOUNTER — Ambulatory Visit (HOSPITAL_BASED_OUTPATIENT_CLINIC_OR_DEPARTMENT_OTHER): Payer: 59

## 2013-08-22 ENCOUNTER — Other Ambulatory Visit: Payer: Self-pay | Admitting: Internal Medicine

## 2013-08-22 NOTE — Telephone Encounter (Signed)
Refill request

## 2013-08-26 ENCOUNTER — Ambulatory Visit (INDEPENDENT_AMBULATORY_CARE_PROVIDER_SITE_OTHER): Payer: 59

## 2013-08-26 DIAGNOSIS — Z1231 Encounter for screening mammogram for malignant neoplasm of breast: Secondary | ICD-10-CM

## 2013-10-01 ENCOUNTER — Ambulatory Visit (INDEPENDENT_AMBULATORY_CARE_PROVIDER_SITE_OTHER): Payer: 59 | Admitting: Internal Medicine

## 2013-10-01 ENCOUNTER — Encounter: Payer: Self-pay | Admitting: Internal Medicine

## 2013-10-01 VITALS — BP 133/85 | HR 81 | Temp 97.2°F | Resp 17 | Ht 63.0 in | Wt 171.0 lb

## 2013-10-01 DIAGNOSIS — Z0000 Encounter for general adult medical examination without abnormal findings: Secondary | ICD-10-CM

## 2013-10-01 DIAGNOSIS — J45909 Unspecified asthma, uncomplicated: Secondary | ICD-10-CM

## 2013-10-01 DIAGNOSIS — E785 Hyperlipidemia, unspecified: Secondary | ICD-10-CM

## 2013-10-01 DIAGNOSIS — J301 Allergic rhinitis due to pollen: Secondary | ICD-10-CM

## 2013-10-01 LAB — POCT URINALYSIS DIPSTICK
BILIRUBIN UA: NEGATIVE
GLUCOSE UA: NEGATIVE
KETONES UA: NEGATIVE
LEUKOCYTES UA: NEGATIVE
PH UA: 8.5
Protein, UA: NEGATIVE
RBC UA: NEGATIVE
Spec Grav, UA: <=1.005
Urobilinogen, UA: 0.2

## 2013-10-01 MED ORDER — PANTOPRAZOLE SODIUM 40 MG PO TBEC: 40.0000 mg | DELAYED_RELEASE_TABLET | Freq: Every day | ORAL | Status: DC

## 2013-10-01 NOTE — Progress Notes (Signed)
Subjective:    Patient ID: Elizabeth Dawson, female    DOB: 09/16/1960, 53 y.o.   MRN: [id number]  HPI  Elizabeth BabinskiMarilyn is here for CPE.'  HM:  Mm neg 6/15,  Colonoscopy done 2012 [place],  She is a non smoker  Pap done by GYN 7/14 had endometrial cells  Asthma  No rescue inhaler use.   Peak flow 350  Hyperliidemia    GERD  States she cannot affford nexium  Ok t change to Protonix  Allergies  Allergen Reactions  . Codeine   . Fenofibrate Other (See Comments)    Headache and leg pain   Past Medical History  Diagnosis Date  . Asthma   . Hyperlipidemia   . Premenopausal menorrhagia   . Sleep apnea    Past Surgical History  Procedure Laterality Date  . Nasal septum surgery  1997    For sleep apnea  . Uvulopalatopharyngoplasty  1997    For sleep apnea   History   Social History  . Marital Status: Divorced    Spouse Name: N/A    Number of Children: 0  . Years of Education: N/A   Occupational History  .  Unemployed   Social History Main Topics  . Smoking status: Never Smoker   . Smokeless tobacco: Never Used  . Alcohol Use: No     Comment: Rarley.   . Drug Use: No  . Sexual Activity: No   Other Topics Concern  . Not on file   Social History Narrative   Some exercise, walking.     Family History  Problem Relation Age of Onset  . Heart disease Mother   . Diabetes Mother   . Heart disease Father   . Mental illness Father     alzheimers   Patient Active Problem List   Diagnosis Date Noted  . Allergic rhinitis 06/06/2012  . 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 Cerumen impaction 06/04/2012  . OSA (obstructive sleep apnea) 05/30/2012  . Elevated blood pressure 05/30/2012  . UNSPECIFIED VITAMIN D DEFICIENCY 06/10/2009  . OBESITY, UNSPECIFIED 05/10/2009  . FATIGUE 04/07/2009  . HYPERLIPIDEMIA 02/22/2009  . ASTHMA 02/22/2009   Current Outpatient Prescriptions on File Prior to Visit  Medication Sig Dispense Refill  . albuterol (VENTOLIN HFA) 108 (90 BASE)  MCG/ACT inhaler Inhale 2 puffs into the lungs every 6 (six) hours as needed for wheezing or shortness of breath.  1 Inhaler  3  . aspirin 81 MG tablet Take 81 mg by mouth daily.      . beclomethasone (QVAR) 40 MCG/ACT inhaler Inhale 2 puffs into the lungs 2 (two) times daily.  1 Inhaler  12  . BIOTIN 5000 PO Take by mouth 2 (two) times daily.      . Cyanocobalamin (VITAMIN B-12 PO) Take by mouth.      . esomeprazole (NEXIUM) 40 MG capsule Take one capsule daily  90 capsule  1  . fluticasone (FLONASE) 50 MCG/ACT nasal spray Place 2 sprays into the nose daily.  16 g  1  . GLUCOSAMINE HCL PO Take by mouth.      . montelukast (SINGULAIR) 10 MG tablet TAKE ONE TABLET BY MOUTH NIGHTLY AT BEDTIME.  90 tablet  1  . Multiple Vitamin (MULTIVITAMIN) tablet Take 1 tablet by mouth daily.      . naphazoline-pheniramine (NAPHCON-A) 0.025-0.3 % ophthalmic solution Place 2 drops into both eyes 4 (four) times daily as needed for irritation. /Itch  15 mL  0  .  Omega-3 Fatty Acids (FISH OIL PO) Take 1 tablet by mouth.      . pyridOXINE (VITAMIN B-6) 100 MG tablet Take 100 mg by mouth daily.      . rosuvastatin (CRESTOR) 20 MG tablet Take 1 tablet (20 mg total) by mouth daily.  30 tablet  1   No current facility-administered medications on file prior to visit.      Review of Systems See HPI    Objective:   Physical Exam Physical Exam  Nursing note and vitals reviewed.  Constitutional: She is oriented to person, place, and time. She appears well-developed and well-nourished.  HENT:  Head: Normocephalic and atraumatic.  Right Ear: Tympanic membrane and ear canal normal. No drainage. Tympanic membrane is not injected and not erythematous.  Left Ear: Tympanic membrane and ear canal normal. No drainage. Tympanic membrane is not injected and not erythematous.  Nose: Nose normal. Right sinus exhibits no maxillary sinus tenderness and no frontal sinus tenderness. Left sinus exhibits no maxillary sinus tenderness  and no frontal sinus tenderness.  Mouth/Throat: Oropharynx is clear and moist. No oral lesions. No oropharyngeal exudate.  Eyes: Conjunctivae and EOM are normal. Pupils are equal, round, and reactive to light.  Neck: Normal range of motion. Neck supple. No JVD present. Carotid bruit is not present. No mass and no thyromegaly present.  Cardiovascular: Normal rate, regular rhythm, S1 normal, S2 normal and intact distal pulses. Exam reveals no gallop and no friction rub.  No murmur heard.  Pulses:  Carotid pulses are 2+ on the right side, and 2+ on the left side.  Dorsalis pedis pulses are 2+ on the right side, and 2+ on the left side.  No carotid bruit. No LE edema  Pulmonary/Chest: Breath sounds normal. She has no wheezes. She has no rales. She exhibits no tenderness.  Breast no discrete masses no nipple  Discharge no axillary adenopathy bilaterally Abdominal: Soft. Bowel sounds are normal. She exhibits no distension and no mass. There is no hepatosplenomegaly. There is no tenderness. There is no CVA tenderness.  Rectal no mass guaiac neg Musculoskeletal: Normal range of motion.  No active synovitis to joints.  Lymphadenopathy:  She has no cervical adenopathy.  She has no axillary adenopathy.  Right: No inguinal and no supraclavicular adenopathy present.  Left: No inguinal and no supraclavicular adenopathy present.  Neurological: She is alert and oriented to person, place, and time. She has normal strength and normal reflexes. She displays no tremor. No cranial nerve deficit or sensory deficit. Coordination and gait normal.  Skin: Skin is warm and dry. No rash noted. No cyanosis. Nails show no clubbing.  Psychiatric: She has a normal mood and affect. Her speech is normal and behavior is normal. Cognition and memory are normal.           Assessment & Plan:  HM  Advised 3 d mm next year,  Colonoscopy due 2022,   Advised to make appointment with Dr. Farrel Dawson for follow up of endometrial  cells.  Sh eis a non smoker advised of new lung cancer screening guidlein  Asthma  Continue Qvar  Allergic rhinitis  Continue meds  GERD  Ok to use protonix  Endometrial cells on pap  Advised ot follow with her gyn   See me as needed

## 2013-10-02 LAB — FOLLICLE STIMULATING HORMONE: FSH: 27.8 m[IU]/mL

## 2013-10-11 ENCOUNTER — Other Ambulatory Visit: Payer: Self-pay | Admitting: Internal Medicine

## 2013-10-13 NOTE — Telephone Encounter (Signed)
Requested Medications     Medication name:  Name from pharmacy:  CRESTOR 20 MG tablet  Crestor Oral Tablet 20 MG    Sig: take one tablet by mouth once daily     Dispense: 30 tablet (Pharmacy requested 30) Refills: 0 Start: 10/11/2013  Class: Normal    Requested on: 07/18/2013    Originally ordered on: 07/18/2010 Last refill: 08/28/2013 Order History and Details

## 2013-12-26 ENCOUNTER — Other Ambulatory Visit: Payer: Self-pay

## 2014-01-08 ENCOUNTER — Other Ambulatory Visit: Payer: Self-pay | Admitting: Internal Medicine

## 2014-01-08 NOTE — Telephone Encounter (Signed)
Refill request

## 2014-01-12 ENCOUNTER — Encounter: Payer: Self-pay | Admitting: Cardiology

## 2014-01-12 ENCOUNTER — Encounter: Payer: Self-pay | Admitting: Internal Medicine

## 2014-03-17 ENCOUNTER — Encounter: Payer: Self-pay | Admitting: *Deleted

## 2014-03-17 ENCOUNTER — Emergency Department
Admission: EM | Admit: 2014-03-17 | Discharge: 2014-03-17 | Disposition: A | Discharge status: Home / Self Care | Payer: 59 | Source: Home / Self Care | Attending: Emergency Medicine | Admitting: Emergency Medicine

## 2014-03-17 DIAGNOSIS — J028 Acute pharyngitis due to other specified organisms: Secondary | ICD-10-CM | POA: Diagnosis not present

## 2014-03-17 LAB — POCT RAPID STREP A (OFFICE): RAPID STREP A SCREEN: NEGATIVE

## 2014-03-17 NOTE — Discharge Instructions (Signed)
Viral Infections °A viral infection can be caused by different types of viruses. Most viral infections are not serious and resolve on their own. However, some infections may cause severe symptoms and may lead to further complications. °SYMPTOMS °Viruses can frequently cause: °· Minor sore throat. °· Aches and pains. °· Headaches. °· Runny nose. °· Different types of rashes. °· Watery eyes. °· Tiredness. °· Cough. °· Loss of appetite. °· Gastrointestinal infections, resulting in nausea, vomiting, and diarrhea. °These symptoms do not respond to antibiotics because the infection is not caused by bacteria. However, you might catch a bacterial infection following the viral infection. This is sometimes called a "superinfection." Symptoms of such a bacterial infection may include: °· Worsening sore throat with pus and difficulty swallowing. °· Swollen neck glands. °· Chills and a high or persistent fever. °· Severe headache. °· Tenderness over the sinuses. °· Persistent overall ill feeling (malaise), muscle aches, and tiredness (fatigue). °· Persistent cough. °· Yellow, green, or brown mucus production with coughing. °HOME CARE INSTRUCTIONS  °· Only take over-the-counter or prescription medicines for pain, discomfort, diarrhea, or fever as directed by your caregiver. °· Drink enough water and fluids to keep your urine clear or pale yellow. Sports drinks can provide valuable electrolytes, sugars, and hydration. °· Get plenty of rest and maintain proper nutrition. Soups and broths with crackers or rice are fine. °SEEK IMMEDIATE MEDICAL CARE IF:  °· You have severe headaches, shortness of breath, chest pain, neck pain, or an unusual rash. °· You have uncontrolled vomiting, diarrhea, or you are unable to keep down fluids. °· You or your child has an oral temperature above 102° F (38.9° C), not controlled by medicine. °· Your baby is older than 3 months with a rectal temperature of 102° F (38.9° C) or higher. °· Your baby is 3  months old or younger with a rectal temperature of 100.4° F (38° C) or higher. °MAKE SURE YOU:  °· Understand these instructions. °· Will watch your condition. °· Will get help right away if you are not doing well or get worse. °Document Released: 12/07/2004 Document Revised: 05/22/2011 Document Reviewed: 07/04/2010 °[employer]® Patient Information ©2015 [employer]. This information is not intended to replace advice given to you by your health care provider. Make sure you discuss any questions you have with your health care provider. ° °Pharyngitis °Pharyngitis is redness, pain, and swelling (inflammation) of your pharynx.  °CAUSES  °Pharyngitis is usually caused by infection. Most of the time, these infections are from viruses (viral) and are part of a cold. However, sometimes pharyngitis is caused by bacteria (bacterial). Pharyngitis can also be caused by allergies. Viral pharyngitis may be spread from person to person by coughing, sneezing, and personal items or utensils (cups, forks, spoons, toothbrushes). Bacterial pharyngitis may be spread from person to person by more intimate contact, such as kissing.  °SIGNS AND SYMPTOMS  °Symptoms of pharyngitis include:   °· Sore throat.   °· Tiredness (fatigue).   °· Low-grade fever.   °· Headache. °· Joint pain and muscle aches. °· Skin rashes. °· Swollen lymph nodes. °· Plaque-like film on throat or tonsils (often seen with bacterial pharyngitis). °DIAGNOSIS  °Your health care provider will ask you questions about your illness and your symptoms. Your medical history, along with a physical exam, is often all that is needed to diagnose pharyngitis. Sometimes, a rapid strep test is done. Other lab tests may also be done, depending on the suspected cause.  °TREATMENT  °Viral pharyngitis will usually get better in   3-4 days without the use of medicine. Bacterial pharyngitis is treated with medicines that kill germs (antibiotics).  °HOME CARE INSTRUCTIONS  °· Drink enough  water and fluids to keep your urine clear or pale yellow.   °· Only take over-the-counter or prescription medicines as directed by your health care provider:   °¨ If you are prescribed antibiotics, make sure you finish them even if you start to feel better.   °¨ Do not take aspirin.   °· Get lots of rest.   °· Gargle with 8 oz of salt water (½ tsp of salt per 1 qt of water) as often as every 1-2 hours to soothe your throat.   °· Throat lozenges (if you are not at risk for choking) or sprays may be used to soothe your throat. °SEEK MEDICAL CARE IF:  °· You have large, tender lumps in your neck. °· You have a rash. °· You cough up green, yellow-brown, or bloody spit. °SEEK IMMEDIATE MEDICAL CARE IF:  °· Your neck becomes stiff. °· You drool or are unable to swallow liquids. °· You vomit or are unable to keep medicines or liquids down. °· You have severe pain that does not go away with the use of recommended medicines. °· You have trouble breathing (not caused by a stuffy nose). °MAKE SURE YOU:  °· Understand these instructions. °· Will watch your condition. °· Will get help right away if you are not doing well or get worse. °Document Released: 02/27/2005 Document Revised: 12/18/2012 Document Reviewed: 11/04/2012 °Exit[employer]® Patient Information ©2015 [employer]. This information is not intended to replace advice given to you by your health care provider. Make sure you discuss any questions you have with your health care provider. ° °

## 2014-03-17 NOTE — ED Provider Notes (Signed)
CSN: [id number]     Arrival date & time 03/17/14  1103 History   First MD Initiated Contact with Patient 03/17/14 1126     Chief Complaint  Patient presents with  . Sore Throat   (Consider location/radiation/quality/duration/timing/severity/associated sxs/prior Treatment) Patient is a 54 y.o. female presenting with pharyngitis. The history is provided by the patient. No language interpreter was used.  Sore Throat This is a new problem. The current episode started 12 to 24 hours ago. The problem occurs constantly. The problem has not changed since onset.Nothing aggravates the symptoms. Nothing relieves the symptoms. She has tried nothing for the symptoms. The treatment provided no relief.  Pt complains of a sorethroat x 1 day.  Pt around several children yesterday  Past Medical History  Diagnosis Date  . Asthma   . Hyperlipidemia   . Premenopausal menorrhagia   . Sleep apnea    Past Surgical History  Procedure Laterality Date  . Nasal septum surgery  1997    For sleep apnea  . Uvulopalatopharyngoplasty  1997    For sleep apnea   Family History  Problem Relation Age of Onset  . Heart disease Mother   . Diabetes Mother   . Heart disease Father   . Mental illness Father     alzheimers   History  Substance Use Topics  . Smoking status: Never Smoker   . Smokeless tobacco: Never Used  . Alcohol Use: No     Comment: [place].    OB History    Gravida Para Term Preterm AB TAB SAB Ectopic Multiple Living   0     Review of Systems  All other systems reviewed and are negative.   Allergies  Codeine and Fenofibrate  Home Medications   Prior to Admission medications   Medication Sig Start Date End Date Taking? Authorizing Provider  esomeprazole (NEXIUM) 40 MG capsule Take one capsule daily 08/06/13  Yes Kendrick Ranch, MD  montelukast (SINGULAIR) 10 MG tablet TAKE ONE TABLET BY MOUTH NIGHTLY AT BEDTIME.   Yes Kendrick Ranch, MD  QVAR 40 MCG/ACT inhaler  Inhale two puffs by mouth twice daily 01/10/14  Yes Kendrick Ranch, MD  albuterol (VENTOLIN HFA) 108 (90 BASE) MCG/ACT inhaler Inhale 2 puffs into the lungs every 6 (six) hours as needed for wheezing or shortness of breath. 05/07/13   Kendrick Ranch, MD  aspirin 81 MG tablet Take 81 mg by mouth daily.    Historical Provider, MD  BIOTIN 5000 PO Take by mouth 2 (two) times daily.    Historical Provider, MD  CRESTOR 20 MG tablet TAKE ONE TABLET BY MOUTH ONE TIME DAILY  01/10/14   Kendrick Ranch, MD  Cyanocobalamin (VITAMIN B-12 PO) Take by mouth.    Historical Provider, MD  fluticasone (FLONASE) 50 MCG/ACT nasal spray Place 2 sprays into the nose daily. 06/06/12   Kendrick Ranch, MD  GLUCOSAMINE HCL PO Take by mouth.    Historical Provider, MD  Multiple Vitamin (MULTIVITAMIN) tablet Take 1 tablet by mouth daily.    Historical Provider, MD  Omega-3 Fatty Acids (FISH OIL PO) Take 1 tablet by mouth.    Historical Provider, MD  pyridOXINE (VITAMIN B-6) 100 MG tablet Take 100 mg by mouth daily.    Historical Provider, MD   BP 144/83 mmHg  Pulse 92  Temp(Src) 97.6 F (36.4 C) (Oral)  Resp 16  Wt 198 lb (89.812 kg)  SpO2  98%  LMP 11/15/2013 Physical Exam  Constitutional: She appears well-developed.  HENT:  Head: Normocephalic and atraumatic.  Right Ear: External ear normal.  Left Ear: External ear normal.  Slight erythema throat, uvula surgically absent   Eyes: Conjunctivae and EOM are normal. Pupils are equal, round, and reactive to light.  Neck: Normal range of motion.  Cardiovascular: Normal rate.   Pulmonary/Chest: Effort normal.  Abdominal: Soft.  Nursing note and vitals reviewed.   ED Course  Procedures (including critical care time) Labs Review Labs Reviewed  POCT RAPID STREP A (OFFICE)  Strep negative  Imaging Review No results found.   MDM   1. Acute pharyngitis due to other specified organisms    Symptomatic treatment Tylenol AVS    Elson AreasLeslie  K Zoeie Ritter, PA-C 03/17/14 1151

## 2014-03-17 NOTE — ED Notes (Signed)
Pt c/o sore throat and HA since yesterday.

## 2014-04-14 ENCOUNTER — Other Ambulatory Visit: Payer: Self-pay | Admitting: Internal Medicine

## 2014-04-14 NOTE — Telephone Encounter (Signed)
Refill request
